# Patient Record
Sex: Male | Born: 1950 | Hispanic: Yes | Marital: Married | Smoking: Never smoker
Health system: Southern US, Community
[De-identification: ages and names within clinical notes are randomized; demographics above are authoritative.]

## PROBLEM LIST (undated history)

## (undated) DIAGNOSIS — G4730 Sleep apnea, unspecified: Secondary | ICD-10-CM

## (undated) DIAGNOSIS — E782 Mixed hyperlipidemia: Secondary | ICD-10-CM

## (undated) DIAGNOSIS — E119 Type 2 diabetes mellitus without complications: Secondary | ICD-10-CM

## (undated) DIAGNOSIS — R7303 Prediabetes: Secondary | ICD-10-CM

## (undated) DIAGNOSIS — I519 Heart disease, unspecified: Secondary | ICD-10-CM

## (undated) HISTORY — DX: Type 2 diabetes mellitus without complications: E11.9

## (undated) HISTORY — PX: NOSE SURGERY: SHX723

## (undated) HISTORY — DX: Sleep apnea, unspecified: G47.30

## (undated) HISTORY — DX: Mixed hyperlipidemia: E78.2

---
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
## 2018-04-06 ENCOUNTER — Ambulatory Visit (INDEPENDENT_AMBULATORY_CARE_PROVIDER_SITE_OTHER): Payer: Medicare HMO | Admitting: Family Medicine

## 2018-04-06 ENCOUNTER — Encounter: Payer: Self-pay | Admitting: Family Medicine

## 2018-04-06 ENCOUNTER — Other Ambulatory Visit: Payer: Self-pay

## 2018-04-06 VITALS — BP 130/70 | HR 64 | Temp 98.2°F | Ht 67.0 in | Wt 249.4 lb

## 2018-04-06 DIAGNOSIS — Z1211 Encounter for screening for malignant neoplasm of colon: Secondary | ICD-10-CM

## 2018-04-06 DIAGNOSIS — R0683 Snoring: Secondary | ICD-10-CM

## 2018-04-06 DIAGNOSIS — L814 Other melanin hyperpigmentation: Secondary | ICD-10-CM

## 2018-04-06 DIAGNOSIS — E669 Obesity, unspecified: Secondary | ICD-10-CM | POA: Diagnosis not present

## 2018-04-06 NOTE — Progress Notes (Signed)
Subjective  CC:  Chief Complaint  Patient presents with  . Establish Care    Transfer from [facility], Last Physical 2 years ago   . Skin Problem    dark spot under left eye, no itching.     HPI: Roy Harris is a 67 y.o. male who presents to [place] at [facility] today to establish care with me as a new patient.   He has the following concerns or needs:  Was at dentist last week: told to get lesion on left cheek checked. Pt reports has had brown growing lesion on left cheek x many years, asymptomatic. Lots of sun exposure - from [place]. No h/o skin cancer or family history of same.   PMH: unremarkable except for obesity: stable. Reports "eats everything". No h/o htn, hld or DM. Feels well.   HM: due for CPE; thinks imms are up to date but not certain. Need records from [place]. Never has had CRC screen: avg risk.   ROS: + snoring and paroxysmal orthopnea. Feels drowsy upon awakening. ? OSA.  Assessment  1. Lentigo   2. Colon cancer screening   3. Snoring   4. Obesity (BMI 30-39.9)      Plan   Lentigo vs lentigo maligna; return for punch bx.   Counseled on crc [place] options. Pt elects cologuard. Ordered.   Snoring: refer for sleep study.   Obesity: rec healthy diet and weight loss.   HM: return for CPE with labs. Will update imms at that time if needed.   Follow up:  Return in about 2 weeks (around 04/20/2018) for punch bx of skin lesion on left cheek, and cpe in 1-6 months. Orders Placed This Encounter  Procedures  . Cologuard  . PSG Sleep Study   No orders of the defined types were placed in this encounter.     We updated and reviewed the patient's past history in detail and it is documented below.  Patient Active Problem List   Diagnosis Date Noted  . Lentigo 04/06/2018  . Colon cancer screening 04/06/2018  . Snoring 04/06/2018  . Obesity (BMI 30-39.9) 04/06/2018   Health Maintenance  Topic Date Due  . Hepatitis C  Screening  [birth year]  . TETANUS/TDAP  04/16/1969  . Fecal DNA (Cologuard)  04/16/2000  . PNA vac Low Risk Adult (1 of 2 - PCV13) 04/17/2015  . INFLUENZA VACCINE  06/08/2018    There is no immunization history on file for this patient. No outpatient medications have been marked as taking for the 04/06/18 encounter (Office Visit) with [name], MD.    Allergies: Patient has No Known Allergies. Past Medical History Patient  has no past medical history on file. Past Surgical History Patient  has no past surgical history on file. Family History: Patient family history includes Alcohol abuse in his father; COPD in his mother; Clotting disorder in his sister; Diabetes in his daughter; Healthy in his son; Kidney disease in his brother; Stroke in his father. Social History:  Patient  reports that he has never smoked. He has never used smokeless tobacco. He reports that he does not drink alcohol or use drugs.  Review of Systems: Constitutional: negative for fever or malaise Ophthalmic: negative for photophobia, double vision or loss of vision, no blurred vision Cardiovascular: negative for chest pain, dyspnea on exertion, or new LE swelling Respiratory: negative for SOB or persistent cough Gastrointestinal: negative for abdominal pain, change in bowel habits or melena Genitourinary: negative  for dysuria or gross hematuria, no polyuria Musculoskeletal: negative for new gait disturbance or muscular weakness Integumentary: negative for new or persistent rashes Neurological: negative for TIA or stroke symptoms Psychiatric: negative for SI or delusions Allergic/Immunologic: negative for hives  Patient Care Team    Relationship Specialty Notifications Start End  [name], MD PCP - General Family Medicine  04/06/18     Objective  Vitals: BP 130/70   Pulse 64   Temp 98.2 F (36.8 C)   Ht 5\' 7"  (1.702 m)   Wt 249 lb 6.4 oz (113.1 kg)   BMI 39.06 kg/m  General:  Well  developed, well nourished, no acute distress  Psych:  Alert and oriented,normal mood and affect HEENT:  Normocephalic, atraumatic, non-icteric sclera, PERRL, oropharynx is without mass or exudate, supple neck  Cardiovascular:  RRR without gallop, rub or murmur, nondisplaced PMI, no peripheral edema Respiratory:  Good breath sounds bilaterally, CTAB with normal respiratory effort Skin:  Warm, left upper cheek with hyperpigmented ovoid lesion approx 1.5cm with mild color variation. Borders are smooth and not raised. Has several lentigo elsewhere, smaller on face, no flaking Neurologic:    Mental status is normal. Gross motor and sensory exams are normal. Normal gait   Commons side effects, risks, benefits, and alternatives for medications and treatment plan prescribed today were discussed, and the patient expressed understanding of the given instructions. Patient is instructed to call or message via MyChart if he/she has any questions or concerns regarding our treatment plan. No barriers to understanding were identified. We discussed Red Flag symptoms and signs in detail. Patient expressed understanding regarding what to do in case of urgent or emergency type symptoms.   Medication list was reconciled, printed and provided to the patient in AVS. Patient instructions and summary information was reviewed with the patient as documented in the AVS. This note was prepared with assistance of Dragon voice recognition software. Occasional wrong-word or sound-a-like substitutions may have occurred due to the inherent limitations of voice recognition software

## 2018-04-06 NOTE — Patient Instructions (Addendum)
Please return in 1-6 months for your annual complete physical; please come fasting. As well, please set up an appointment for a biopsy of the lesion on your cheek to make sure it is ok.  Please sign a release of records so I may request your records from [facility] center.   We will call you with information regarding your referral appointment. Sleep medicine for a sleep apnea evaluation   It was a pleasure meeting you today! Thank you for choosing [place] to meet your healthcare needs! I truly look forward to working with you. If you have any questions or concerns, please send me a message via Mychart or call the office at [phone].  Sleep Studies A sleep study (polysomnogram) is a series of tests done while you are sleeping. It can show how well you sleep. This can help your health care provider diagnose a sleep disorder and show how severe your sleep disorder is. A sleep study may lead to treatment that will help you sleep better and prevent other medical problems caused by poor sleep. If you have a sleep disorder, you may also be at risk for:  Sleep-related accidents.  High blood pressure.  Heart disease.  Stroke.  Other medical conditions.  Sleep disorders are common. Your health care provider may suspect a sleep disorder if you:  Have loud snoring most nights.  Have brief periods when you stop breathing at night.  Feel sleepy on most days.  Fall asleep suddenly during the day.  Have trouble falling asleep or staying asleep.  Feel like you need to move your legs when trying to fall asleep.  Have dreams that seem very real shortly after falling asleep.  Feel like you cannot move when you first wake up.  Which tests will I need to have? Most sleep studies last all night and include these tests:  Recordings of your brain activity.  Recordings of your eye movements.  Recording of your heart rate and rhythm.  Blood pressure readings.  Readings of the amount of  oxygen in your blood.  Measurements of your chest and belly movement as you breathe during sleep.  If you have signs of the sleep disorder called sleep apnea during your test, you may get a mask to wear for the second half of the night.  The mask provides continuous positive airway pressure (CPAP). This may improve sleep apnea significantly.  You will then have all tests done again with the mask in place to see if your measurements and recordings change.  How are sleep studies done? Most sleep studies are done over one full night of sleep.  You will arrive at the study center in the evening and can go home in the morning.  Bring your pajamas and toothbrush.  Do not have caffeine on the day of your sleep study.  Your health care provider will let you know if you need to stop taking any of your regular medicines before the test.  To do the tests included in a polysomnogram, you will have:  Round, sticky patches with sensors attached to recording wires (electrodes) placed on your scalp, face, chest, and limbs.  Wires from all the electrodes and sensors run from your bed to a computer. The wires can be taken off and put back on if you need to get out of bed to go to the bathroom.  A sensor placed over your nose to measure airflow.  A finger clip put on one finger to measure your blood  oxygen level.  A belt around your belly and a belt around your chest to measure breathing movements.  Where are sleep studies done? Sleep studies are done at sleep centers. A sleep center may be inside a hospital, office, or clinic. The room where you have the study may look like a hospital room or a hotel room. The health care providers doing the study may come in and out of the room during the study. Most of the time, they will be in another room monitoring your test. How is information from sleep studies helpful? A polysomnogram can be used along with your medical history and a physical exam to  diagnose conditions, such as:  Sleep apnea.  Restless legs syndrome.  Sleep-related seizure disorders.  Sleep-related movement disorders.  A medical doctor who specializes in sleep will evaluate your sleep study. The specialist will share the results with your primary health care provider. Treatments based on your sleep study may include:  Improving your sleep habits (sleep hygiene).  Wearing a CPAP mask.  Wearing an oral device at night to improve breathing and reduce snoring.  Taking medicine for: ? Restless legs syndrome. ? Sleep-related seizure disorder. ? Sleep-related movement disorder.  This information is not intended to replace advice given to you by your health care provider. Make sure you discuss any questions you have with your health care provider. Document Released: 05/01/2003 Document Revised: 06/20/2016 Document Reviewed: 12/31/2013 Elsevier Interactive Patient Education  2018 [employer].   Fat and Cholesterol Restricted Diet Getting too much fat and cholesterol in your diet may cause health problems. Following this diet helps keep your fat and cholesterol at normal levels. This can keep you from getting sick. What types of fat should I choose?  Choose monosaturated and polyunsaturated fats. These are found in foods such as olive oil, canola oil, flaxseeds, walnuts, almonds, and seeds.  Eat more omega-3 fats. Good choices include salmon, mackerel, sardines, tuna, flaxseed oil, and ground flaxseeds.  Limit saturated fats. These are in animal products such as meats, butter, and cream. They can also be in plant products such as palm oil, palm kernel oil, and coconut oil.  Avoid foods with partially hydrogenated oils in them. These contain trans fats. Examples of foods that have trans fats are stick margarine, some tub margarines, cookies, crackers, and other baked goods. What general guidelines do I need to follow?  Check food labels. Look for the words "trans  fat" and "saturated fat."  When preparing a meal: ? Fill half of your plate with vegetables and green salads. ? Fill one fourth of your plate with whole grains. Look for the word "whole" as the first word in the ingredient list. ? Fill one fourth of your plate with lean protein foods.  Eat more foods that have fiber, like apples, carrots, beans, peas, and barley.  Eat more home-cooked foods. Eat less at restaurants and buffets.  Limit or avoid alcohol.  Limit foods high in starch and sugar.  Limit fried foods.  Cook foods without frying them. Baking, boiling, grilling, and broiling are all great options.  Lose weight if you are overweight. Losing even a small amount of weight can help your overall health. It can also help prevent diseases such as diabetes and heart disease. What foods can I eat? Grains Whole grains, such as whole wheat or whole grain breads, crackers, cereals, and pasta. Unsweetened oatmeal, bulgur, barley, quinoa, or brown rice. Corn or whole wheat flour tortillas. Vegetables Fresh or frozen  vegetables (raw, steamed, roasted, or grilled). Green salads. Fruits All fresh, canned (in natural juice), or frozen fruits. Meat and Other Protein Products Ground beef (85% or leaner), grass-fed beef, or beef trimmed of fat. Skinless chicken or [place]. Ground chicken or [place]. Pork trimmed of fat. All fish and seafood. Eggs. Dried beans, peas, or lentils. Unsalted nuts or seeds. Unsalted canned or dry beans. Dairy Low-fat dairy products, such as skim or 1% milk, 2% or reduced-fat cheeses, low-fat ricotta or cottage cheese, or plain low-fat yogurt. Fats and Oils Tub margarines without trans fats. Light or reduced-fat mayonnaise and salad dressings. Avocado. Olive, canola, sesame, or safflower oils. Natural peanut or almond butter (choose ones without added sugar and oil). The items listed above may not be a complete list of recommended foods or beverages. Contact your dietitian  for more options. What foods are not recommended? Grains White bread. White pasta. White rice. Cornbread. Bagels, pastries, and croissants. Crackers that contain trans fat. Vegetables White potatoes. Corn. Creamed or fried vegetables. Vegetables in a cheese sauce. Fruits Dried fruits. Canned fruit in light or heavy syrup. Fruit juice. Meat and Other Protein Products Fatty cuts of meat. Ribs, chicken wings, bacon, sausage, bologna, salami, chitterlings, fatback, hot dogs, bratwurst, and packaged luncheon meats. Liver and organ meats. Dairy Whole or 2% milk, cream, half-and-half, and cream cheese. Whole milk cheeses. Whole-fat or sweetened yogurt. Full-fat cheeses. Nondairy creamers and whipped toppings. Processed cheese, cheese spreads, or cheese curds. Sweets and Desserts Corn syrup, sugars, honey, and molasses. Candy. Jam and jelly. Syrup. Sweetened cereals. Cookies, pies, cakes, donuts, muffins, and ice cream. Fats and Oils Butter, stick margarine, lard, shortening, ghee, or bacon fat. Coconut, palm kernel, or palm oils. Beverages Alcohol. Sweetened drinks (such as sodas, lemonade, and fruit drinks or punches). The items listed above may not be a complete list of foods and beverages to avoid. Contact your dietitian for more information. This information is not intended to replace advice given to you by your health care provider. Make sure you discuss any questions you have with your health care provider. Document Released: 04/25/2012 Document Revised: 07/01/2016 Document Reviewed: 01/24/2014 Elsevier Interactive Patient Education  2018 [employer].   Exercise to Lose Weight Exercise and a healthy diet may help you lose weight. Your doctor may suggest specific exercises. EXERCISE IDEAS AND TIPS  Choose low-cost things you enjoy doing, such as walking, bicycling, or exercising to workout videos.  Take stairs instead of the elevator.  Walk during your lunch break.  Park your car  further away from work or school.  Go to a gym or an exercise class.  Start with 5 to 10 minutes of exercise each day. Build up to 30 minutes of exercise 4 to 6 days a week.  Wear shoes with good support and comfortable clothes.  Stretch before and after working out.  Work out until you breathe harder and your heart beats faster.  Drink extra water when you exercise.  Do not do so much that you hurt yourself, feel dizzy, or get very short of breath. Exercises that burn about 150 calories:  Running 1  miles in 15 minutes.  Playing volleyball for 45 to 60 minutes.  Washing and waxing a car for 45 to 60 minutes.  Playing touch football for 45 minutes.  Walking 1  miles in 35 minutes.  Pushing a stroller 1  miles in 30 minutes.  Playing basketball for 30 minutes.  Raking leaves for 30 minutes.  Bicycling 5  miles in 30 minutes.  Walking 2 miles in 30 minutes.  Dancing for 30 minutes.  Shoveling snow for 15 minutes.  Swimming laps for 20 minutes.  Walking up stairs for 15 minutes.  Bicycling 4 miles in 15 minutes.  Gardening for 30 to 45 minutes.  Jumping rope for 15 minutes.  Washing windows or floors for 45 to 60 minutes.

## 2018-04-07 ENCOUNTER — Other Ambulatory Visit: Payer: Self-pay | Admitting: *Deleted

## 2018-04-07 DIAGNOSIS — L989 Disorder of the skin and subcutaneous tissue, unspecified: Secondary | ICD-10-CM

## 2018-04-10 ENCOUNTER — Ambulatory Visit: Payer: Medicare HMO | Admitting: Family Medicine

## 2018-04-18 ENCOUNTER — Telehealth: Payer: Self-pay | Admitting: Family Medicine

## 2018-04-18 NOTE — Telephone Encounter (Signed)
Paperwork filled out and faxed to [phone].    Doloris Hall,  LPN

## 2018-04-18 NOTE — Telephone Encounter (Signed)
Received paperwork, via fax, from [facility] in reference to Clinical Review for patient orders. Requesting the paperwork be reviewed, completed, and faxed back. Placed in front bin with charge sheet. Tracking # for case: [id number]

## 2018-04-21 ENCOUNTER — Telehealth: Payer: Self-pay | Admitting: Family Medicine

## 2018-04-21 NOTE — Telephone Encounter (Signed)
Received paperwork, via fax, from [place]. Requesting the paperwork be reviewed, completed, and faxed back. Placed in front bin with charge sheet.

## 2018-04-24 NOTE — Telephone Encounter (Signed)
Forms faxed back with requested clinical information

## 2018-04-25 ENCOUNTER — Telehealth: Payer: Self-pay | Admitting: Emergency Medicine

## 2018-04-25 DIAGNOSIS — R0683 Snoring: Secondary | ICD-10-CM

## 2018-04-25 NOTE — Telephone Encounter (Signed)
Please advise.    Reason for CRM: Dr. Quentin Cornwall called in to request peer to peer consultation with Dr. Jonni Sanger. Cb# [phone]

## 2018-04-25 NOTE — Telephone Encounter (Signed)
Peer to Peer call completed between Dr. Jonni Sanger & Dr. Quentin Cornwall.   Doloris Hall,  LPN

## 2018-04-25 NOTE — Telephone Encounter (Signed)
[facility] prefers home sleep study. Ordered.

## 2018-04-28 ENCOUNTER — Encounter: Payer: Medicare HMO | Admitting: Family Medicine

## 2018-05-01 LAB — COLOGUARD: Cologuard: NEGATIVE

## 2018-05-02 NOTE — Progress Notes (Signed)
Please call patient: I have reviewed his/her lab results. cologuard testing is negative; can be repeated in 3 years. This is good news.

## 2018-06-01 ENCOUNTER — Encounter: Payer: Medicare HMO | Admitting: Family Medicine

## 2018-06-06 ENCOUNTER — Encounter: Payer: Medicare HMO | Admitting: Family Medicine

## 2018-06-06 DIAGNOSIS — Z0289 Encounter for other administrative examinations: Secondary | ICD-10-CM

## 2018-06-30 ENCOUNTER — Ambulatory Visit (INDEPENDENT_AMBULATORY_CARE_PROVIDER_SITE_OTHER): Payer: Medicare HMO | Admitting: Physician Assistant

## 2018-06-30 ENCOUNTER — Other Ambulatory Visit: Payer: Self-pay

## 2018-06-30 ENCOUNTER — Encounter: Payer: Self-pay | Admitting: Physician Assistant

## 2018-06-30 VITALS — BP 110/80 | HR 85 | Temp 98.0°F | Resp 16 | Ht 67.0 in | Wt 218.0 lb

## 2018-06-30 DIAGNOSIS — L239 Allergic contact dermatitis, unspecified cause: Secondary | ICD-10-CM | POA: Diagnosis not present

## 2018-06-30 DIAGNOSIS — M722 Plantar fascial fibromatosis: Secondary | ICD-10-CM

## 2018-06-30 DIAGNOSIS — H6981 Other specified disorders of Eustachian tube, right ear: Secondary | ICD-10-CM

## 2018-06-30 MED ORDER — MELOXICAM 15 MG PO TABS: 15.0000 mg | ORAL_TABLET | Freq: Every day | ORAL | 0 refills | Status: DC

## 2018-06-30 MED ORDER — FLUTICASONE PROPIONATE 50 MCG/ACT NA SUSP: 2.0000 | Freq: Every day | NASAL | 6 refills | Status: DC

## 2018-06-30 MED ORDER — TRIAMCINOLONE ACETONIDE 0.1 % EX CREA: 1.0000 "application " | TOPICAL_CREAM | Freq: Two times a day (BID) | CUTANEOUS | 0 refills | Status: DC

## 2018-06-30 NOTE — Patient Instructions (Signed)
Apply the triamcinolone cream to the rash as directed twice daily for 2 weeks. Avoid scratching the area if possible.   For the ear pressure, start your Flonase once daily as directed. Continue your saline nasal rinses.   For the plantar fasciitis, take Meloxicam once daily with food. Use tylenol if needed for breakthrough pain.  Wear supportive footwear.  Start the cold can exercises that we discussed.  Start the recommended exercises below. If not improving, we will need a further assessment with Podiatry.   Plantar Fasciitis Rehab Ask your health care provider which exercises are safe for you. Do exercises exactly as told by your health care provider and adjust them as directed. It is normal to feel mild stretching, pulling, tightness, or discomfort as you do these exercises, but you should stop right away if you feel sudden pain or your pain gets worse. Do not begin these exercises until told by your health care provider. Stretching and range of motion exercises These exercises warm up your muscles and joints and improve the movement and flexibility of your foot. These exercises also help to relieve pain. Exercise A: Plantar fascia stretch  1. Sit with your left / right leg crossed over your opposite knee. 2. Hold your heel with one hand with that thumb near your arch. With your other hand, hold your toes and gently pull them back toward the top of your foot. You should feel a stretch on the bottom of your toes or your foot or both. 3. Hold this stretch for__________ seconds. 4. Slowly release your toes and return to the starting position. Repeat __________ times. Complete this exercise __________ times a day. Exercise B: Gastroc, standing  1. Stand with your hands against a wall. 2. Extend your left / right leg behind you, and bend your front knee slightly. 3. Keeping your heels on the floor and keeping your back knee straight, shift your weight toward the wall without arching your  back. You should feel a gentle stretch in your left / right calf. 4. Hold this position for __________ seconds. Repeat __________ times. Complete this exercise __________ times a day. Exercise C: Soleus, standing 1. Stand with your hands against a wall. 2. Extend your left / right leg behind you, and bend your front knee slightly. 3. Keeping your heels on the floor, bend your back knee and slightly shift your weight over the back leg. You should feel a gentle stretch deep in your calf. 4. Hold this position for __________ seconds. Repeat __________ times. Complete this exercise __________ times a day. Exercise D: Gastrocsoleus, standing 1. Stand with the ball of your left / right foot on a step. The ball of your foot is on the walking surface, right under your toes. 2. Keep your other foot firmly on the same step. 3. Hold onto the wall or a railing for balance. 4. Slowly lift your other foot, allowing your body weight to press your heel down over the edge of the step. You should feel a stretch in your left / right calf. 5. Hold this position for __________ seconds. 6. Return both feet to the step. 7. Repeat this exercise with a slight bend in your left / right knee. Repeat __________ times with your left / right knee straight and __________ times with your left / right knee bent. Complete this exercise __________ times a day. Balance exercise This exercise builds your balance and strength control of your arch to help take pressure off your plantar fascia.  Exercise E: Single leg stand 1. Without shoes, stand near a railing or in a doorway. You may hold onto the railing or door frame as needed. 2. Stand on your left / right foot. Keep your big toe down on the floor and try to keep your arch lifted. Do not let your foot roll inward. 3. Hold this position for __________ seconds. 4. If this exercise is too easy, you can try it with your eyes closed or while standing on a pillow. Repeat __________  times. Complete this exercise __________ times a day. This information is not intended to replace advice given to you by your health care provider. Make sure you discuss any questions you have with your health care provider. Document Released: 10/25/2005 Document Revised: 06/29/2016 Document Reviewed: 09/08/2015 Elsevier Interactive Patient Education  2018 [employer].

## 2018-06-30 NOTE — Progress Notes (Signed)
Patient presents to clinic today c/o 8 months of R heel pain described as a sharp pain. Is worse with ambulation and with his first step in the morning and after an episode of prolonged sitting. Notes his first few steps are horrendous. Denies any trauma or injury. Denies skin changes. Has not taken anything for symptoms.   Patient also notes an itchy red rash of his arms, legs and torso starting 1 week ago after working outside. Denies fever, chills. Denies change to soaps/lotions/detergents. Denies recent travel or sick contact.   Patient also endorses a couple of weeks of pressure and popping in his R ear. Denies decreased hearing but notes it is somewhat muffled. Denies tinnitus, ear pain or discharge.   History reviewed. No pertinent past medical history.  No current outpatient medications on file prior to visit.   No current facility-administered medications on file prior to visit.     No Known Allergies  Family History  Problem Relation Age of Onset  . COPD Mother   . Stroke Father   . Alcohol abuse Father   . Clotting disorder Sister   . Diabetes Daughter   . Healthy Son   . Kidney disease Brother   . Cancer Neg Hx     Social History   Socioeconomic History  . Marital status: Married    Spouse name: Not on file  . Number of children: 3  . Years of education: Not on file  . Highest education level: Not on file  Occupational History  . Occupation: [employer], Ambulance person RETIRED  Social Needs  . Financial resource strain: Not on file  . Food insecurity:    Worry: Not on file    Inability: Not on file  . Transportation needs:    Medical: Not on file    Non-medical: Not on file  Tobacco Use  . Smoking status: Never Smoker  . Smokeless tobacco: Never Used  Substance and Sexual Activity  . Alcohol use: Never    Frequency: Never  . Drug use: Never  . Sexual activity: Yes  Lifestyle  . Physical activity:    Days per week: Not on file    Minutes per  session: Not on file  . Stress: Not on file  Relationships  . Social connections:    Talks on phone: Not on file    Gets together: Not on file    Attends religious service: Not on file    Active member of club or organization: Not on file    Attends meetings of clubs or organizations: Not on file    Relationship status: Not on file  Other Topics Concern  . Not on file  Social History Narrative   Originally from [place], moved to [place] in 2012; married. 3 children. Family in here in [place] as well   Review of Systems - See HPI.  All other ROS are negative.  BP 110/80   Pulse 85   Temp 98 F (36.7 C) (Oral)   Resp 16   Ht 5\' 7"  (1.702 m)   Wt 218 lb (98.9 kg)   SpO2 97%   BMI 34.14 kg/m   Physical Exam  Constitutional: He appears well-developed and well-nourished.  HENT:  Head: Normocephalic and atraumatic.  Right Ear: Hearing and ear canal normal.  Left Ear: Hearing and ear canal normal.  Serous fluid noted behind R TM  Neck: Neck supple.  Cardiovascular: Normal rate, regular rhythm, normal heart sounds and intact distal pulses.  Pulmonary/Chest: Effort normal.  Musculoskeletal:       Feet:  Lymphadenopathy:    He has no cervical adenopathy.  Skin:     Psychiatric: He has a normal mood and affect.  Vitals reviewed.   Recent Results (from the past 2160 hour(s))  Cologuard     Status: None   Collection Time: 04/22/18 12:00 AM  Result Value Ref Range   Cologuard Negative     Assessment/Plan: 1. Plantar fasciitis, right Start meloxicam once daily with food. Tylenol for breakthrough pain. Start cold can exercise. Rehabilitation exercises reviewed and handout given. Discussed need for supportive footwear. Will need assessment by Podiatry if not resolving. - meloxicam (MOBIC) 15 MG tablet; Take 1 tablet (15 mg total) by mouth daily.  Dispense: 15 tablet; Refill: 0  2. Allergic contact dermatitis, unspecified trigger Mild, small patches. Start Triamcinolone.  Supportive measures reviewed. - triamcinolone cream (KENALOG) 0.1 %; Apply 1 application topically 2 (two) times daily.  Dispense: 30 g; Refill: 0  3. Eustachian tube dysfunction, right Start Flonase to help with this. Continue saline nasal rinse and insufflation. - fluticasone (FLONASE) 50 MCG/ACT nasal spray; Place 2 sprays into both nostrils daily.  Dispense: 16 g; Refill: [place], [place]

## 2018-07-07 ENCOUNTER — Other Ambulatory Visit: Payer: Self-pay

## 2018-07-07 ENCOUNTER — Encounter: Payer: Self-pay | Admitting: Family Medicine

## 2018-07-07 ENCOUNTER — Ambulatory Visit (INDEPENDENT_AMBULATORY_CARE_PROVIDER_SITE_OTHER): Payer: Medicare HMO | Admitting: Family Medicine

## 2018-07-07 VITALS — BP 106/72 | HR 59 | Temp 97.6°F | Ht 67.0 in | Wt 248.2 lb

## 2018-07-07 DIAGNOSIS — Z0000 Encounter for general adult medical examination without abnormal findings: Secondary | ICD-10-CM

## 2018-07-07 DIAGNOSIS — R0683 Snoring: Secondary | ICD-10-CM | POA: Diagnosis not present

## 2018-07-07 DIAGNOSIS — E785 Hyperlipidemia, unspecified: Secondary | ICD-10-CM | POA: Diagnosis not present

## 2018-07-07 DIAGNOSIS — L255 Unspecified contact dermatitis due to plants, except food: Secondary | ICD-10-CM | POA: Diagnosis not present

## 2018-07-07 DIAGNOSIS — Z1159 Encounter for screening for other viral diseases: Secondary | ICD-10-CM

## 2018-07-07 DIAGNOSIS — Z23 Encounter for immunization: Secondary | ICD-10-CM | POA: Diagnosis not present

## 2018-07-07 DIAGNOSIS — E669 Obesity, unspecified: Secondary | ICD-10-CM

## 2018-07-07 LAB — LIPID PANEL
Cholesterol: 216 mg/dL — ABNORMAL HIGH (ref 0–200)
HDL: 31.6 mg/dL — AB (ref >39.00)
LDL Cholesterol: 160 mg/dL — ABNORMAL HIGH (ref 0–99)
NONHDL: 184.49
Total CHOL/HDL Ratio: 7
Triglycerides: 123 mg/dL (ref 0.0–149.0)
VLDL: 24.6 mg/dL (ref 0.0–40.0)

## 2018-07-07 LAB — CBC WITH DIFFERENTIAL/PLATELET
BASOS PCT: 0.6 % (ref 0.0–3.0)
Basophils Absolute: 0 10*3/uL (ref 0.0–0.1)
EOS PCT: 2.4 % (ref 0.0–5.0)
Eosinophils Absolute: 0.1 10*3/uL (ref 0.0–0.7)
HCT: 45.4 % (ref 39.0–52.0)
HEMOGLOBIN: 15.1 g/dL (ref 13.0–17.0)
LYMPHS ABS: 1.4 10*3/uL (ref 0.7–4.0)
Lymphocytes Relative: 28 % (ref 12.0–46.0)
MCHC: 33.3 g/dL (ref 30.0–36.0)
MCV: 88.9 fl (ref 78.0–100.0)
MONO ABS: 0.4 10*3/uL (ref 0.1–1.0)
Monocytes Relative: 8.9 % (ref 3.0–12.0)
NEUTROS ABS: 3 10*3/uL (ref 1.4–7.7)
Neutrophils Relative %: 60.1 % (ref 43.0–77.0)
PLATELETS: 181 10*3/uL (ref 150.0–400.0)
RBC: 5.11 Mil/uL (ref 4.22–5.81)
RDW: 13.2 % (ref 11.5–15.5)
WBC: 5 10*3/uL (ref 4.0–10.5)

## 2018-07-07 LAB — COMPREHENSIVE METABOLIC PANEL
ALK PHOS: 69 U/L (ref 39–117)
ALT: 30 U/L (ref 0–53)
AST: 27 U/L (ref 0–37)
Albumin: 4.1 g/dL (ref 3.5–5.2)
BILIRUBIN TOTAL: 1.8 mg/dL — AB (ref 0.2–1.2)
BUN: 18 mg/dL (ref 6–23)
CALCIUM: 9.4 mg/dL (ref 8.4–10.5)
CO2: 26 mEq/L (ref 19–32)
Chloride: 106 mEq/L (ref 96–112)
Creatinine, Ser: 1 mg/dL (ref 0.40–1.50)
GFR: 78.93 mL/min (ref >60.00)
GLUCOSE: 107 mg/dL — AB (ref 70–99)
Potassium: 4.3 mEq/L (ref 3.5–5.1)
Sodium: 139 mEq/L (ref 135–145)
Total Protein: 6.7 g/dL (ref 6.0–8.3)

## 2018-07-07 MED ORDER — ZOSTER VAC RECOMB ADJUVANTED 50 MCG/0.5ML IM SUSR: 0.5000 mL | Freq: Once | INTRAMUSCULAR | 0 refills | Status: AC

## 2018-07-07 MED ORDER — PREDNISONE 10 MG PO TABS: ORAL_TABLET | ORAL | 0 refills | Status: DC

## 2018-07-07 MED ORDER — PNEUMOCOCCAL 13-VAL CONJ VACC IM SUSP: 0.5000 mL | Freq: Once | INTRAMUSCULAR | 0 refills | Status: AC

## 2018-07-07 NOTE — Patient Instructions (Signed)
Please return in 12 months for your annual complete physical; please come fasting. We will call you with your lab results or send you a letter if they are all normal.   Please go to your pharmacy to pick up the prednisone and to get your pneumonia and shingles vaccinations.   We will call you with information regarding your referral appointment. Sleep study.  If you do not hear from [place] within the next 2 weeks, please let me know. It can take 1-2 weeks to get appointments set up with the specialists.    If you have any questions or concerns, please don't hesitate to send me a message via MyChart or call the office at [phone]. Thank you for visiting with [place] today! It's our pleasure caring for you.  Please do these things to maintain good health!   Exercise at least 30-45 minutes a day,  4-5 days a week.   Eat a low-fat diet with lots of fruits and vegetables, up to 7-9 servings per day.  Drink plenty of water daily. Try to drink 8 8oz glasses per day.  Seatbelts can save your life. Always wear your seatbelt.  Place Smoke Detectors on every level of your home and check batteries every year.  Eye Doctor - have an eye exam every 1-2 years  Safe sex - use condoms to protect yourself from STDs if you could be exposed to these types of infections.  Avoid heavy alcohol use. If you drink, keep it to less than 2 drinks/day and not every day.  [place].  Choose someone you trust that could speak for you if you became unable to speak for yourself.  Depression is common in our stressful world.If you're feeling down or losing interest in things you normally enjoy, please come in for a visit.

## 2018-07-07 NOTE — Progress Notes (Signed)
Subjective  Chief Complaint  Patient presents with  . Annual Exam    doing well, wants flu shot, Pneumonia and Tdap today   . Skin Problem    itching all over body, not as bad as it was but still there   HPI: Roy Harris is a 67 y.o. male who presents to [place] [facility] today for a Male Wellness Visit.   Wellness Visit: annual visit with health maintenance review and exam    67 year old male here for complete physical.  Says he is doing very well.  Tries to eat well.  But does need to lose weight.  Reviewed healthy eating habits review of systems is negative except for itching:  Seen last week for rash and treated with steroid cream, however 3 weeks of maculopapular rash.  He had been working out in the yard.  Rashes on extremities mostly.  No hives.  No systemic symptoms.  Snoring and feeling tired during the day: Sleep study was never scheduled.  See last visit.  Health maintenance: Due for Tdap, Prevnar, flu vaccination, Shingrix.  Cologuard was negative. Lifestyle: Body mass index is 38.87 kg/m. Wt Readings from Last 3 Encounters:  07/07/18 248 lb 3.2 oz (112.6 kg)  06/30/18 218 lb (98.9 kg)  04/06/18 249 lb 6.4 oz (113.1 kg)    Patient Active Problem List   Diagnosis Date Noted  . Lentigo 04/06/2018  . Colon cancer screening 04/06/2018  . Snoring 04/06/2018  . Obesity (BMI 30-39.9) 04/06/2018   Health Maintenance  Topic Date Due  . Hepatitis C Screening  04-05-1951  . TETANUS/TDAP  04/16/1969  . PNA vac Low Risk Adult (1 of 2 - PCV13) 04/17/2015  . INFLUENZA VACCINE  06/08/2018  . Fecal DNA (Cologuard)  04/22/2021    There is no immunization history on file for this patient. We updated and reviewed the patient's past history in detail and it is documented below. Allergies: Patient has No Known Allergies. Past Medical History  has no past medical history on file. Past Surgical History  has no past surgical history on  file. Social History Patient  reports that he has never smoked. He has never used smokeless tobacco. He reports that he does not drink alcohol or use drugs. Family History Patient family history includes Alcohol abuse in his father; COPD in his mother; Clotting disorder in his sister; Diabetes in his daughter; Healthy in his son; Kidney disease in his brother; Stroke in his father. Review of Systems: Constitutional: negative for fever or malaise Ophthalmic: negative for photophobia, double vision or loss of vision Cardiovascular: negative for chest pain, dyspnea on exertion, or new LE swelling Respiratory: negative for SOB or persistent cough Gastrointestinal: negative for abdominal pain, change in bowel habits or melena Genitourinary: negative for dysuria or gross hematuria Musculoskeletal: negative for new gait disturbance or muscular weakness Integumentary: negative for new or persistent rashes, no breast lumps Neurological: negative for TIA or stroke symptoms Psychiatric: negative for SI or delusions Allergic/Immunologic: negative for hives  Patient Care Team    Relationship Specialty Notifications Start End  [name], MD PCP - General Family Medicine  04/06/18    Objective  Vitals: BP 106/72   Pulse (!) 59   Temp 97.6 F (36.4 C)   Ht 5\' 7"  (1.702 m)   Wt 248 lb 3.2 oz (112.6 kg)   SpO2 98%   BMI 38.87 kg/m  General:  Well developed, well nourished, no acute distress  Psych:  Alert and orientedx3,normal mood and affect HEENT:  Normocephalic, atraumatic, non-icteric sclera, PERRL, oropharynx is clear without mass or exudate, supple neck without adenopathy, mass or thyromegaly Cardiovascular:  Normal S1, S2, RRR without gallop, rub or murmur, nondisplaced PMI, +2 distal pulses in bilateral upper and lower extremities. Respiratory:  Good breath sounds bilaterally, CTAB with normal respiratory effort Gastrointestinal: normal bowel sounds, soft, non-tender, no noted masses.  No HSM MSK: no deformities, contusions. Joints are without erythema or swelling. Spine and CVA region are nontender Skin:  Warm, excoriations on upper extremities and lower extremities, maculopapular rash, some linear excoriations present.   Neurologic:    Mental status is normal. CN 2-11 are normal. Gross motor and sensory exams are normal. Stable gait. No tremor GU: No inguinal hernias or adenopathy are appreciated bilaterally  Assessment  1. Annual physical exam   2. Rhus dermatitis   3. Snoring   4. Obesity (BMI 30-39.9)      Plan  Male Wellness Visit:  Age appropriate Health Maintenance and Prevention measures were discussed with patient. Included topics are cancer screening recommendations, ways to keep healthy (see AVS) including dietary and exercise recommendations, regular eye and dental care, use of seat belts, and avoidance of moderate alcohol use and tobacco use.   BMI: discussed patient's BMI and encouraged positive lifestyle modifications to help get to or maintain a target BMI.  HM needs and immunizations were addressed and ordered. See below for orders. See HM and immunization section for updates.  Tdap and flu given today.  Prescriptions for Shingrix and Prevnar given and education done.  Routine labs and screening tests ordered including cmp, cbc and lipids where appropriate.  Discussed recommendations regarding Vit D and calcium supplementation (see AVS)  Suspect poison ivy: Prednisone taper ordered  Reorder sleep study to evaluate for sleep apnea  Follow up: No follow-ups on file.   Commons side effects, risks, benefits, and alternatives for medications and treatment plan prescribed today were discussed, and the patient expressed understanding of the given instructions. Patient is instructed to call or message via MyChart if he/she has any questions or concerns regarding our treatment plan. No barriers to understanding were identified. We discussed Red Flag  symptoms and signs in detail. Patient expressed understanding regarding what to do in case of urgent or emergency type symptoms.   Medication list was reconciled, printed and provided to the patient in AVS. Patient instructions and summary information was reviewed with the patient as documented in the AVS. This note was prepared with assistance of Dragon voice recognition software. Occasional wrong-word or sound-a-like substitutions may have occurred due to the inherent limitations of voice recognition software  No orders of the defined types were placed in this encounter.  Meds ordered this encounter  Medications  . predniSONE (DELTASONE) 10 MG tablet    Sig: Take 4 tabs qd x 2 days, 3 qd x 2 days, 2 qd x 2d, 1qd x 3 days    Dispense:  21 tablet    Refill:  0  . pneumococcal 13-valent conjugate vaccine (PREVNAR 13) SUSP injection    Sig: Inject 0.5 mLs into the muscle once for 1 dose.    Dispense:  0.5 mL    Refill:  0  . Zoster Vaccine Adjuvanted [facility]) injection    Sig: Inject 0.5 mLs into the muscle once for 1 dose. Please give 2nd dose 2-6 months after first dose    Dispense:  2 each    Refill:  0

## 2018-07-11 ENCOUNTER — Encounter: Payer: Self-pay | Admitting: Family Medicine

## 2018-07-11 DIAGNOSIS — E1169 Type 2 diabetes mellitus with other specified complication: Secondary | ICD-10-CM | POA: Insufficient documentation

## 2018-07-11 DIAGNOSIS — E782 Mixed hyperlipidemia: Secondary | ICD-10-CM

## 2018-07-11 HISTORY — DX: Mixed hyperlipidemia: E78.2

## 2018-07-11 LAB — HEPATITIS C ANTIBODY
Hepatitis C Ab: NONREACTIVE
SIGNAL TO CUT-OFF: 0.02 (ref <1.00)

## 2018-07-11 LAB — HIV ANTIBODY (ROUTINE TESTING W REFLEX): HIV: NONREACTIVE

## 2018-07-11 MED ORDER — ATORVASTATIN CALCIUM 10 MG PO TABS: 10.0000 mg | ORAL_TABLET | Freq: Every day | ORAL | 3 refills | Status: DC

## 2018-07-11 NOTE — Progress Notes (Signed)
Please call patient: I have reviewed his/her lab results. Lab results show elevated cholesterol levels. I recommend starting lipitor 10mg  nightly. Recommend an OV with me in 12 weeks to recheck and assess tolerance. Please come fasting. Other labs look fine. He has an elevated bilirubin but all other liver tests are normal: this could be a chronic finding. I will monitor over time. The 10-year ASCVD risk score Mikey Bussing DC Brooke Bonito., et al., 2013) is: 15.2%   Values used to calculate the score:     Age: 67 years     Sex: Male     Is Non-Hispanic African American: No     Diabetic: No     Tobacco smoker: No     Systolic Blood Pressure: 572 mmHg     Is BP treated: No     HDL Cholesterol: 31.6 mg/dL     Total Cholesterol: 216 mg/dL

## 2018-07-11 NOTE — Addendum Note (Signed)
Addended by: Billey Chang on: 07/11/2018 02:46 PM   Modules accepted: Orders

## 2018-07-13 ENCOUNTER — Telehealth: Payer: Self-pay | Admitting: Family Medicine

## 2018-07-13 NOTE — Telephone Encounter (Signed)
Copied from [place] [id number]. Topic: Quick Communication - See Telephone Encounter >> Jul 13, 2018 11:05 AM Gardiner Ramus wrote: CRM for notification. See Telephone encounter for: 07/13/18. Pt called and stated that they would like the last AVS 07/07/18 emailed to him. [email or web address]  please advise Cb#[phone]

## 2018-07-13 NOTE — Telephone Encounter (Signed)
Last Office Visit Faxed to Patient at [phone]  Doloris Hall,  LPN

## 2018-10-11 ENCOUNTER — Ambulatory Visit: Payer: Medicare HMO | Admitting: Family Medicine

## 2018-10-11 DIAGNOSIS — Z0289 Encounter for other administrative examinations: Secondary | ICD-10-CM

## 2019-05-31 ENCOUNTER — Ambulatory Visit (INDEPENDENT_AMBULATORY_CARE_PROVIDER_SITE_OTHER): Payer: Medicare HMO | Admitting: Family Medicine

## 2019-05-31 ENCOUNTER — Ambulatory Visit (INDEPENDENT_AMBULATORY_CARE_PROVIDER_SITE_OTHER): Payer: Medicare HMO

## 2019-05-31 ENCOUNTER — Other Ambulatory Visit: Payer: Self-pay

## 2019-05-31 ENCOUNTER — Encounter: Payer: Self-pay | Admitting: Family Medicine

## 2019-05-31 VITALS — BP 132/76 | HR 67 | Temp 98.0°F | Resp 16 | Ht 63.0 in | Wt 255.0 lb

## 2019-05-31 DIAGNOSIS — R0683 Snoring: Secondary | ICD-10-CM | POA: Diagnosis not present

## 2019-05-31 DIAGNOSIS — R0602 Shortness of breath: Secondary | ICD-10-CM

## 2019-05-31 DIAGNOSIS — M25562 Pain in left knee: Secondary | ICD-10-CM | POA: Diagnosis not present

## 2019-05-31 DIAGNOSIS — E782 Mixed hyperlipidemia: Secondary | ICD-10-CM | POA: Diagnosis not present

## 2019-05-31 LAB — LIPID PANEL
Cholesterol: 192 mg/dL (ref 0–200)
HDL: 32 mg/dL — ABNORMAL LOW (ref >39.00)
NonHDL: 159.55
Total CHOL/HDL Ratio: 6
Triglycerides: 267 mg/dL — ABNORMAL HIGH (ref 0.0–149.0)
VLDL: 53.4 mg/dL — ABNORMAL HIGH (ref 0.0–40.0)

## 2019-05-31 LAB — COMPREHENSIVE METABOLIC PANEL
ALT: 20 U/L (ref 0–53)
AST: 14 U/L (ref 0–37)
Albumin: 4.1 g/dL (ref 3.5–5.2)
Alkaline Phosphatase: 72 U/L (ref 39–117)
BUN: 20 mg/dL (ref 6–23)
CO2: 27 mEq/L (ref 19–32)
Calcium: 9.7 mg/dL (ref 8.4–10.5)
Chloride: 105 mEq/L (ref 96–112)
Creatinine, Ser: 1 mg/dL (ref 0.40–1.50)
GFR: 74.06 mL/min (ref >60.00)
Glucose, Bld: 140 mg/dL — ABNORMAL HIGH (ref 70–99)
Potassium: 3.9 mEq/L (ref 3.5–5.1)
Sodium: 138 mEq/L (ref 135–145)
Total Bilirubin: 1.6 mg/dL — ABNORMAL HIGH (ref 0.2–1.2)
Total Protein: 6.4 g/dL (ref 6.0–8.3)

## 2019-05-31 LAB — LDL CHOLESTEROL, DIRECT: Direct LDL: 131 mg/dL

## 2019-05-31 MED ORDER — ATORVASTATIN CALCIUM 10 MG PO TABS: 10.0000 mg | ORAL_TABLET | Freq: Every day | ORAL | 3 refills | Status: DC

## 2019-05-31 MED ORDER — DICLOFENAC SODIUM 75 MG PO TBEC: 75.0000 mg | DELAYED_RELEASE_TABLET | Freq: Two times a day (BID) | ORAL | 0 refills | Status: DC

## 2019-05-31 NOTE — Progress Notes (Signed)
Subjective  CC:  Chief Complaint  Patient presents with  . Knee Pain    Left side, got worse over the past 3 days and is taking Tylenol Arthritis with minimal relief  . Shortness of Breath    worse at night and wakes up disoriented     HPI: Roy Harris is a 68 y.o. male who presents to the office today to address the problems listed above in the chief complaint.  68 year old male with obesity presents with 2 to 3-day history of medial left knee pain.  Came on abruptly.  Sharp pain worse with walking.  Also painful when he moves or tries to turn over in bed at night.  Minimal pain if sitting.  Had similar pain a year or 2 ago which resolved spontaneously.  No history of osteoarthritis, no trauma orLimping.  No pain into the hip.  No swelling or warmth noted.  Tylenol was mildly helpful.  Obesity with snoring, wife witnesses apneic episodes at night.  Admits to daytime lethargy.  Ready to do sleep study to rule out obstructive sleep apnea.  He is high risk  Next hyperlipidemia: Started Lipitor last year, he did not follow-up for LFTs or repeat lipids.  He took it up until about a month ago when he said he ran out.  No myalgias or adverse effects.  He will restart it. Assessment  1. Snoring   2. Mixed hyperlipidemia   3. Acute pain of left knee   4. SOB (shortness of breath)      Plan   Snoring with witnessed apnea: Refer for sleep study.  Recommend weight loss  Next hyperlipidemia: Restart Lipitor and recheck labs today.  Then follow-up at complete physical in the next 6 weeks  Knee pain consistent with bursitis or osteoarthritis flare.  Start diclofenac twice a day, check x-rays.  Icing.  Follow-up if unimproved.  Follow up: August or September for complete physical and follow-up lipidemia Visit date not found  Orders Placed This Encounter  Procedures  . DG Knee AP/LAT W/Sunrise Left  . Comprehensive metabolic panel  . Lipid panel  . Ambulatory referral to  Pulmonology   Meds ordered this encounter  Medications  . atorvastatin (LIPITOR) 10 MG tablet    Sig: Take 1 tablet (10 mg total) by mouth daily.    Dispense:  90 tablet    Refill:  3  . diclofenac (VOLTAREN) 75 MG EC tablet    Sig: Take 1 tablet (75 mg total) by mouth 2 (two) times daily. For 2 weeks, then as needed    Dispense:  30 tablet    Refill:  0      I reviewed the patients updated PMH, FH, and SocHx.    Patient Active Problem List   Diagnosis Date Noted  . Mixed hyperlipidemia 07/11/2018  . Lentigo 04/06/2018  . Colon cancer screening 04/06/2018  . Snoring 04/06/2018  . Obesity (BMI 30-39.9) 04/06/2018   Current Meds  Medication Sig  . [DISCONTINUED] fluticasone (FLONASE) 50 MCG/ACT nasal spray Place 2 sprays into both nostrils daily.  . [DISCONTINUED] meloxicam (MOBIC) 15 MG tablet Take 1 tablet (15 mg total) by mouth daily.  . [DISCONTINUED] predniSONE (DELTASONE) 10 MG tablet Take 4 tabs qd x 2 days, 3 qd x 2 days, 2 qd x 2d, 1qd x 3 days  . [DISCONTINUED] triamcinolone cream (KENALOG) 0.1 % Apply 1 application topically 2 (two) times daily.    Allergies: Patient has No Known Allergies. Family History: Patient  family history includes Alcohol abuse in his father; COPD in his mother; Clotting disorder in his sister; Diabetes in his daughter; Healthy in his son; Kidney disease in his brother; Stroke in his father. Social History:  Patient  reports that he has never smoked. He has never used smokeless tobacco. He reports that he does not drink alcohol or use drugs.  Review of Systems: Constitutional: Negative for fever malaise or anorexia Cardiovascular: negative for chest pain Respiratory: negative for SOB or persistent cough Gastrointestinal: negative for abdominal pain  Objective  Vitals: BP 132/76   Pulse 67   Temp 98 F (36.7 C) (Oral)   Resp 16   Ht 5\' 3"  (1.6 m)   Wt 255 lb (115.7 kg)   SpO2 97%   BMI 45.17 kg/m  General: no acute distress ,  A&Ox3 HEENT: PEERL, conjunctiva normal, Oropharynx moist,neck is supple Cardiovascular:  RRR without murmur or gallop.  Respiratory:  Good breath sounds bilaterally, CTAB with normal respiratory effort Skin:  Warm, no rashes Left knee: Normal-appearing, no warmth or effusions, full range of motion without crepitus.  Medial tenderness present, no patellar tenderness or apprehension.  Normal gait     Commons side effects, risks, benefits, and alternatives for medications and treatment plan prescribed today were discussed, and the patient expressed understanding of the given instructions. Patient is instructed to call or message via MyChart if he/she has any questions or concerns regarding our treatment plan. No barriers to understanding were identified. We discussed Red Flag symptoms and signs in detail. Patient expressed understanding regarding what to do in case of urgent or emergency type symptoms.   Medication list was reconciled, printed and provided to the patient in AVS. Patient instructions and summary information was reviewed with the patient as documented in the AVS. This note was prepared with assistance of Dragon voice recognition software. Occasional wrong-word or sound-a-like substitutions may have occurred due to the inherent limitations of voice recognition software

## 2019-05-31 NOTE — Patient Instructions (Signed)
Please return in late august or September for your annual complete physical; please come fasting.  Start icing your left knee for 10 minutes 2-3x/day.  Take the anti-inflammatory medication for two weeks.  Please restart your lipitor.  We will call you with information regarding your referral appointment. Sleep medicine/study. If you do not hear from [place] within the next 2 weeks, please let me know. It can take 1-2 weeks to get appointments set up with the specialists.    If you have any questions or concerns, please don't hesitate to send me a message via MyChart or call the office at [phone]. Thank you for visiting with [place] today! It's our pleasure caring for you.

## 2019-06-03 NOTE — Progress Notes (Signed)
Please call patient: I have reviewed his/her lab results. Xray shows mild arthritis in the area where he is having pain. Follow instructions given at office visit.

## 2019-06-03 NOTE — Progress Notes (Signed)
Please call patient: I have reviewed his/her lab results. Please call pt and have him restart his lipitor as we discussed at his visit. And ensure he has f/u visi tin 6-8 weeks for cpe and recheck lipids and sugars. Thanks.    The 10-year ASCVD risk score Mikey Bussing DC Brooke Bonito., et al., 2013) is: 21.2%   Values used to calculate the score:     Age: 68 years     Sex: Male     Is Non-Hispanic African American: No     Diabetic: No     Tobacco smoker: No     Systolic Blood Pressure: 794 mmHg     Is BP treated: No     HDL Cholesterol: 32 mg/dL     Total Cholesterol: 192 mg/dL

## 2019-06-29 ENCOUNTER — Other Ambulatory Visit: Payer: Self-pay

## 2019-06-29 ENCOUNTER — Encounter: Payer: Self-pay | Admitting: Family Medicine

## 2019-06-29 ENCOUNTER — Ambulatory Visit (INDEPENDENT_AMBULATORY_CARE_PROVIDER_SITE_OTHER): Payer: Medicare HMO | Admitting: Family Medicine

## 2019-06-29 VITALS — BP 136/88 | HR 59 | Temp 98.0°F | Ht 63.0 in | Wt 257.0 lb

## 2019-06-29 DIAGNOSIS — G8929 Other chronic pain: Secondary | ICD-10-CM

## 2019-06-29 DIAGNOSIS — M25562 Pain in left knee: Secondary | ICD-10-CM

## 2019-06-29 DIAGNOSIS — N528 Other male erectile dysfunction: Secondary | ICD-10-CM | POA: Diagnosis not present

## 2019-06-29 MED ORDER — ACETAMINOPHEN-CODEINE #3 300-30 MG PO TABS: 1.0000 | ORAL_TABLET | Freq: Four times a day (QID) | ORAL | 0 refills | Status: DC | PRN

## 2019-06-29 MED ORDER — SILDENAFIL CITRATE 100 MG PO TABS: ORAL_TABLET | ORAL | 0 refills | Status: DC

## 2019-06-29 MED ORDER — DICLOFENAC SODIUM 1 % TD GEL: 4.0000 g | Freq: Four times a day (QID) | TRANSDERMAL | 1 refills | Status: DC

## 2019-06-29 NOTE — Progress Notes (Signed)
Patient: Roy Harris MRN: [id number] DOB: 06-Aug-1951 PCP: [name], MD     Subjective:  Chief Complaint  Patient presents with  . L knee pain x 2 mos    HPI: The patient is a 68 y.o. male who presents today for left knee pain x 2 months. Saw Dr.[name] in July and xray was done which showed minimal degenerative change at the medial aspect of the knee. Told to ice knee and take NSAIDs x 2 weeks. He states the pain never got better even with the NSAIDs. The NSAID did not help at all. He states the pain is about the same as it was in July. It is constant and now it is difficult for him to walk. Pain is located on medial aspect of left knee and his knee is swelling. He denies any trauma to this knee. Pain rated as an 8/10 and is described as sharp. When he walks pain radiates over entire patellar area. Nothing makes it better, not even non weight bearing activities. Walking and twisting make it worse. Denies any injury to this knee.   Also has complaints of erectile dysfunction. This started about 8 months ago. No precipitating factors. Has had no trauma to pelvic area, no smoking/diabetes/HTN. His relationship is good. Wants to know what he can do for this. He has no problems getting an erection, just maintaining his erection. He has been married 68 years.   Review of Systems  Constitutional: Negative for chills, fatigue and fever.  Respiratory: Positive for apnea. Negative for cough and shortness of breath.   Cardiovascular: Positive for leg swelling. Negative for chest pain and palpitations.  Genitourinary: Negative for discharge, penile pain, scrotal swelling and testicular pain.       Erectile dysfunction  Musculoskeletal: Positive for arthralgias, gait problem (due to knee pain) and joint swelling. Negative for back pain and myalgias.       C/o worsening L knee pain x 2 mos.   Skin: Negative for rash.    Allergies Patient has No Known Allergies.  Past Medical  History Patient  has a past medical history of Mixed hyperlipidemia (07/11/2018).  Surgical History Patient  has no past surgical history on file.  Family History Pateint's family history includes Alcohol abuse in his father; COPD in his mother; Clotting disorder in his sister; Diabetes in his daughter; Healthy in his son; Kidney disease in his brother; Stroke in his father.  Social History Patient  reports that he has never smoked. He has never used smokeless tobacco. He reports that he does not drink alcohol or use drugs.    Objective: Vitals:   06/29/19 0823  Weight: 257 lb (116.6 kg)  Height: 5\' 3"  (1.6 m)    Body mass index is 45.53 kg/m.  Physical Exam Vitals signs reviewed.  Constitutional:      Appearance: He is obese.  HENT:     Head: Normocephalic and atraumatic.  Musculoskeletal:        General: Tenderness (to lateral aspect of left knee. no tenderness over patella. can extend 180 degress and flex past 90 degrees. + pain wiht varus and valgus strain along tibeal line. negative draw signs.  gait with limp ) present.  Neurological:     Mental Status: He is alert.        Assessment/plan:  1. Chronic pain of left knee Xray no acute findings. Failed 6 weeks of conservative therapy with nsaids/brace. MRI to rule out ligament issue or other pathology. Recommended weight  loss, voltaren gel QID, tylenol 3 for severe pain and brace.  - MR Knee Left  Wo Contrast; Future   2. Other male erectile dysfunction Trial of viagra. Weight loss will also help. Discussed side effects of viagra and precautions given for priapism. Would f/u with Dr. Jonni Sanger in 1 month to see if this is helping.     Return in about 1 month (around 07/30/2019) for ED.  Orma Flaming, MD [place]   06/29/2019

## 2019-06-29 NOTE — Patient Instructions (Signed)
For knee pain.... 1) start using voltaren gel four times a day to knee. It's a topical anti inflammatory that you can use instead of taking oral pills and increasing risk of ulcer/bleeding in your belly  2) continue ice/brace 3) tylenol 3 for severe pain. Has codeine in this and may make you drowsy. Please be careful 4) ordering MRI to make sure no tear in ligament or other issue going on...   Erectile dysfunction  Starting 1/2 tab of viagra as needed 30 minutes before sex. Remember can drop your blood pressure and make you dizzy. Also if erection longer than 4 hours must go to ER. It's an emergency.   Weight loss will help both of these issues.   Nice to meet you today!  Dr. Rogers Blocker

## 2019-07-02 ENCOUNTER — Other Ambulatory Visit: Payer: Self-pay | Admitting: Family Medicine

## 2019-07-12 ENCOUNTER — Ambulatory Visit (INDEPENDENT_AMBULATORY_CARE_PROVIDER_SITE_OTHER): Payer: Medicare HMO | Admitting: Pulmonary Disease

## 2019-07-12 ENCOUNTER — Encounter: Payer: Self-pay | Admitting: Pulmonary Disease

## 2019-07-12 VITALS — BP 120/80 | HR 60 | Temp 97.3°F | Ht 66.0 in | Wt 258.2 lb

## 2019-07-12 DIAGNOSIS — R062 Wheezing: Secondary | ICD-10-CM

## 2019-07-12 DIAGNOSIS — R0683 Snoring: Secondary | ICD-10-CM

## 2019-07-12 NOTE — Patient Instructions (Signed)
High probability of obstructive sleep apnea  We will set you up with a home sleep study Call you with findings as soon as we review results  Treatment options as we discussed today  I will see you back in the office in 3 months for follow-up  Call with significant concerns Sleep Apnea Sleep apnea is a condition in which breathing pauses or becomes shallow during sleep. Episodes of sleep apnea usually last 10 seconds or longer, and they may occur as many as 20 times an hour. Sleep apnea disrupts your sleep and keeps your body from getting the rest that it needs. This condition can increase your risk of certain health problems, including:  Heart attack.  Stroke.  Obesity.  Diabetes.  Heart failure.  Irregular heartbeat. What are the causes? There are three kinds of sleep apnea:  Obstructive sleep apnea. This kind is caused by a blocked or collapsed airway.  Central sleep apnea. This kind happens when the part of the brain that controls breathing does not send the correct signals to the muscles that control breathing.  Mixed sleep apnea. This is a combination of obstructive and central sleep apnea. The most common cause of this condition is a collapsed or blocked airway. An airway can collapse or become blocked if:  Your throat muscles are abnormally relaxed.  Your tongue and tonsils are larger than normal.  You are overweight.  Your airway is smaller than normal. What increases the risk? You are more likely to develop this condition if you:  Are overweight.  Smoke.  Have a smaller than normal airway.  Are elderly.  Are male.  Drink alcohol.  Take sedatives or tranquilizers.  Have a family history of sleep apnea. What are the signs or symptoms? Symptoms of this condition include:  Trouble staying asleep.  Daytime sleepiness and tiredness.  Irritability.  Loud snoring.  Morning headaches.  Trouble concentrating.  Forgetfulness.  Decreased  interest in sex.  Unexplained sleepiness.  Mood swings.  Personality changes.  Feelings of depression.  Waking up often during the night to urinate.  Dry mouth.  Sore throat. How is this diagnosed? This condition may be diagnosed with:  A medical history.  A physical exam.  A series of tests that are done while you are sleeping (sleep study). These tests are usually done in a sleep lab, but they may also be done at home. How is this treated? Treatment for this condition aims to restore normal breathing and to ease symptoms during sleep. It may involve managing health issues that can affect breathing, such as high blood pressure or obesity. Treatment may include:  Sleeping on your side.  Using a decongestant if you have nasal congestion.  Avoiding the use of depressants, including alcohol, sedatives, and narcotics.  Losing weight if you are overweight.  Making changes to your diet.  Quitting smoking.  Using a device to open your airway while you sleep, such as: ? An oral appliance. This is a custom-made mouthpiece that shifts your lower jaw forward. ? A continuous positive airway pressure (CPAP) device. This device blows air through a mask when you breathe out (exhale). ? A nasal expiratory positive airway pressure (EPAP) device. This device has valves that you put into each nostril. ? A bi-level positive airway pressure (BPAP) device. This device blows air through a mask when you breathe in (inhale) and breathe out (exhale).  Having surgery if other treatments do not work. During surgery, excess tissue is removed to create a  wider airway. It is important to get treatment for sleep apnea. Without treatment, this condition can lead to:  High blood pressure.  Coronary artery disease.  In men, an inability to achieve or maintain an erection (impotence).  Reduced thinking abilities. Follow these instructions at home: Lifestyle  Make any lifestyle changes that your  health care provider recommends.  Eat a healthy, well-balanced diet.  Take steps to lose weight if you are overweight.  Avoid using depressants, including alcohol, sedatives, and narcotics.  Do not use any products that contain nicotine or tobacco, such as cigarettes, e-cigarettes, and chewing tobacco. If you need help quitting, ask your health care provider. General instructions  Take over-the-counter and prescription medicines only as told by your health care provider.  If you were given a device to open your airway while you sleep, use it only as told by your health care provider.  If you are having surgery, make sure to tell your health care provider you have sleep apnea. You may need to bring your device with you.  Keep all follow-up visits as told by your health care provider. This is important. Contact a health care provider if:  The device that you received to open your airway during sleep is uncomfortable or does not seem to be working.  Your symptoms do not improve.  Your symptoms get worse. Get help right away if:  You develop: ? Chest pain. ? Shortness of breath. ? Discomfort in your back, arms, or stomach.  You have: ? Trouble speaking. ? Weakness on one side of your body. ? Drooping in your face. These symptoms may represent a serious problem that is an emergency. Do not wait to see if the symptoms will go away. Get medical help right away. Call your local emergency services (911 in the [place] Do not drive yourself to the hospital. Summary  Sleep apnea is a condition in which breathing pauses or becomes shallow during sleep.  The most common cause is a collapsed or blocked airway.  The goal of treatment is to restore normal breathing and to ease symptoms during sleep. This information is not intended to replace advice given to you by your health care provider. Make sure you discuss any questions you have with your health care provider. Document Released:  10/15/2002 Document Revised: 08/11/2018 Document Reviewed: 06/20/2018 Elsevier Patient Education  2020 [employer].

## 2019-07-12 NOTE — Progress Notes (Signed)
Roy Harris    IN:[id number]    [birth year]  Primary Care Physician:[name], [name], MD  Referring Physician: [name], [address],  [place] [address]  Chief complaint:   Being seen for snoring, witnessed apneas  HPI:  History of snoring for many years Wakes up with panic episodes Headache and neck ache in the mornings Dryness of his mouth in the mornings Wakes up tired on most days Has been noted to hold his breath for over a minute His memory is good Occasional choking episodes during sleep  Usually goes to bed about 10 PM, takes him about 10 minutes to fall asleep, wakes up about 3-4 times during the night multiple reasons-especially to go to the bathroom Final awakening time about 6 AM  His weight has been steady  He has some shortness of breath and wheezing  Son has OSA  Never smoker  He has occasional shortness of breath with wheezing He does not think he is limited with activities  Pets: No pets Occupation: Retired Airline pilot Exposures: No significant exposure history  Outpatient Encounter Medications as of 07/12/2019  Medication Sig  . acetaminophen-codeine (TYLENOL #3) 300-30 MG tablet Take 1 tablet by mouth every 6 (six) hours as needed for moderate pain or severe pain.  Marland Kitchen atorvastatin (LIPITOR) 10 MG tablet Take 1 tablet (10 mg total) by mouth daily.  . sildenafil (VIAGRA) 100 MG tablet 1/2 tablet 30 minutes before sex as needed.  . diclofenac (VOLTAREN) 75 MG EC tablet Take 1 tablet (75 mg total) by mouth 2 (two) times daily. For 2 weeks, then as needed (Patient not taking: Reported on 07/12/2019)  . diclofenac sodium (VOLTAREN) 1 % GEL Apply 4 g topically 4 (four) times daily. (Patient not taking: Reported on 07/12/2019)   No facility-administered encounter medications on file as of 07/12/2019.     Allergies as of 07/12/2019  . (No Known Allergies)    Past Medical History:  Diagnosis Date  . Mixed  hyperlipidemia 07/11/2018    History reviewed. No pertinent surgical history.  Family History  Problem Relation Age of Onset  . COPD Mother   . Stroke Father   . Alcohol abuse Father   . Clotting disorder Sister   . Diabetes Daughter   . Healthy Son   . Kidney disease Brother   . Cancer Neg Hx     Social History   Socioeconomic History  . Marital status: Married    Spouse name: Not on file  . Number of children: 3  . Years of education: Not on file  . Highest education level: Not on file  Occupational History  . Occupation: [employer], Ambulance person RETIRED  Social Needs  . Financial resource strain: Not on file  . Food insecurity    Worry: Not on file    Inability: Not on file  . Transportation needs    Medical: Not on file    Non-medical: Not on file  Tobacco Use  . Smoking status: Never Smoker  . Smokeless tobacco: Never Used  Substance and Sexual Activity  . Alcohol use: Never    Frequency: Never  . Drug use: Never  . Sexual activity: Yes  Lifestyle  . Physical activity    Days per week: Not on file    Minutes per session: Not on file  . Stress: Not on file  Relationships  . Social connections  Talks on phone: Not on file    Gets together: Not on file    Attends religious service: Not on file    Active member of club or organization: Not on file    Attends meetings of clubs or organizations: Not on file    Relationship status: Not on file  . Intimate partner violence    Fear of current or ex partner: Not on file    Emotionally abused: Not on file    Physically abused: Not on file    Forced sexual activity: Not on file  Other Topics Concern  . Not on file  Social History Narrative   Originally from [place], moved to [place] in 2012; married. 3 children. Family in here in [place] as well    Review of Systems  HENT: Negative.   Eyes: Negative.   Respiratory: Positive for apnea, shortness of breath and wheezing.   Cardiovascular: Negative.    Gastrointestinal: Negative.   Endocrine: Negative.   Psychiatric/Behavioral: Positive for sleep disturbance.  All other systems reviewed and are negative.   Vitals:   07/12/19 0925 07/12/19 0926  BP:  120/80  Pulse:  60  Temp: (!) 97.3 F (36.3 C)   SpO2:  97%     Physical Exam  Constitutional: He appears well-developed and well-nourished.  Obese  HENT:  Head: Normocephalic and atraumatic.  Crowded oropharynx, Mallampati 3  Eyes: Pupils are equal, round, and reactive to light. Conjunctivae are normal. Right eye exhibits no discharge.  Neck: Normal range of motion. Neck supple. No tracheal deviation present. No thyromegaly present.  Cardiovascular: Normal rate, regular rhythm and normal heart sounds.  Pulmonary/Chest: Effort normal and breath sounds normal. No respiratory distress. He has no wheezes.  Abdominal: Soft. Bowel sounds are normal. He exhibits no distension. There is no abdominal tenderness. There is no rebound.   Epworth sleepiness of 6  Assessment:  High probability of significant obstructive sleep apnea -Significant symptoms -Crowded oropharynx -Obesity  Daytime sleepiness  Nonrestorative sleep  Shortness of breath and wheezing -Never smoker -May be related to his obese status  Plan/Recommendations: Pathophysiology of sleep disordered breathing discussed with the patient  Options of treatment discussed with the patient  We will schedule the patient for home sleep study  We will obtain a pulmonary function test on the patient  I will see him back in the office in about 3 months  Encouraged to call with any significant concerns   Sherrilyn Rist MD [facility] 07/12/2019, 9:43 AM  CC: [name], MD

## 2019-07-22 ENCOUNTER — Other Ambulatory Visit: Payer: Self-pay

## 2019-07-22 ENCOUNTER — Ambulatory Visit
Admission: RE | Admit: 2019-07-22 | Discharge: 2019-07-22 | Disposition: A | Discharge status: Home / Self Care | Payer: Medicare HMO | Source: Ambulatory Visit | Attending: Family Medicine | Admitting: Family Medicine

## 2019-07-22 DIAGNOSIS — G8929 Other chronic pain: Secondary | ICD-10-CM

## 2019-07-22 DIAGNOSIS — M25562 Pain in left knee: Secondary | ICD-10-CM

## 2019-07-23 ENCOUNTER — Ambulatory Visit (INDEPENDENT_AMBULATORY_CARE_PROVIDER_SITE_OTHER): Payer: Medicare HMO | Admitting: Family Medicine

## 2019-07-23 ENCOUNTER — Encounter: Payer: Self-pay | Admitting: Family Medicine

## 2019-07-23 VITALS — BP 132/80 | HR 63 | Temp 97.3°F | Resp 16 | Ht 63.0 in | Wt 256.4 lb

## 2019-07-23 DIAGNOSIS — R0683 Snoring: Secondary | ICD-10-CM | POA: Diagnosis not present

## 2019-07-23 DIAGNOSIS — R600 Localized edema: Secondary | ICD-10-CM | POA: Diagnosis not present

## 2019-07-23 DIAGNOSIS — E782 Mixed hyperlipidemia: Secondary | ICD-10-CM

## 2019-07-23 DIAGNOSIS — E1065 Type 1 diabetes mellitus with hyperglycemia: Secondary | ICD-10-CM

## 2019-07-23 DIAGNOSIS — Z23 Encounter for immunization: Secondary | ICD-10-CM

## 2019-07-23 DIAGNOSIS — N528 Other male erectile dysfunction: Secondary | ICD-10-CM

## 2019-07-23 DIAGNOSIS — Z0000 Encounter for general adult medical examination without abnormal findings: Secondary | ICD-10-CM

## 2019-07-23 DIAGNOSIS — G8929 Other chronic pain: Secondary | ICD-10-CM

## 2019-07-23 DIAGNOSIS — R0600 Dyspnea, unspecified: Secondary | ICD-10-CM

## 2019-07-23 DIAGNOSIS — M25562 Pain in left knee: Secondary | ICD-10-CM

## 2019-07-23 LAB — COMPREHENSIVE METABOLIC PANEL
ALT: 21 U/L (ref 0–53)
AST: 15 U/L (ref 0–37)
Albumin: 4.1 g/dL (ref 3.5–5.2)
Alkaline Phosphatase: 68 U/L (ref 39–117)
BUN: 20 mg/dL (ref 6–23)
CO2: 25 mEq/L (ref 19–32)
Calcium: 9.7 mg/dL (ref 8.4–10.5)
Chloride: 106 mEq/L (ref 96–112)
Creatinine, Ser: 0.93 mg/dL (ref 0.40–1.50)
GFR: 80.5 mL/min (ref >60.00)
Glucose, Bld: 111 mg/dL — ABNORMAL HIGH (ref 70–99)
Potassium: 4.2 mEq/L (ref 3.5–5.1)
Sodium: 140 mEq/L (ref 135–145)
Total Bilirubin: 1.1 mg/dL (ref 0.2–1.2)
Total Protein: 6.5 g/dL (ref 6.0–8.3)

## 2019-07-23 LAB — CBC WITH DIFFERENTIAL/PLATELET
Basophils Absolute: 0.1 10*3/uL (ref 0.0–0.1)
Basophils Relative: 0.9 % (ref 0.0–3.0)
Eosinophils Absolute: 0.1 10*3/uL (ref 0.0–0.7)
Eosinophils Relative: 1.6 % (ref 0.0–5.0)
HCT: 44.8 % (ref 39.0–52.0)
Hemoglobin: 14.7 g/dL (ref 13.0–17.0)
Lymphocytes Relative: 25.7 % (ref 12.0–46.0)
Lymphs Abs: 1.5 10*3/uL (ref 0.7–4.0)
MCHC: 32.9 g/dL (ref 30.0–36.0)
MCV: 91.4 fl (ref 78.0–100.0)
Monocytes Absolute: 0.5 10*3/uL (ref 0.1–1.0)
Monocytes Relative: 8 % (ref 3.0–12.0)
Neutro Abs: 3.7 10*3/uL (ref 1.4–7.7)
Neutrophils Relative %: 63.8 % (ref 43.0–77.0)
Platelets: 185 10*3/uL (ref 150.0–400.0)
RBC: 4.9 Mil/uL (ref 4.22–5.81)
RDW: 13.4 % (ref 11.5–15.5)
WBC: 5.9 10*3/uL (ref 4.0–10.5)

## 2019-07-23 LAB — LIPID PANEL
Cholesterol: 158 mg/dL (ref 0–200)
HDL: 34.2 mg/dL — ABNORMAL LOW (ref >39.00)
LDL Cholesterol: 98 mg/dL (ref 0–99)
NonHDL: 123.83
Total CHOL/HDL Ratio: 5
Triglycerides: 130 mg/dL (ref 0.0–149.0)
VLDL: 26 mg/dL (ref 0.0–40.0)

## 2019-07-23 LAB — TSH: TSH: 1.3 u[IU]/mL (ref 0.35–4.50)

## 2019-07-23 MED ORDER — PREVNAR 13 IM SUSP: 0.5000 mL | Freq: Once | INTRAMUSCULAR | 0 refills | Status: DC

## 2019-07-23 MED ORDER — FUROSEMIDE 20 MG PO TABS: 20.0000 mg | ORAL_TABLET | Freq: Every day | ORAL | 3 refills | Status: DC | PRN

## 2019-07-23 MED ORDER — PREVNAR 13 IM SUSP: 0.5000 mL | Freq: Once | INTRAMUSCULAR | 0 refills | Status: AC

## 2019-07-23 MED ORDER — POTASSIUM CHLORIDE CRYS ER 20 MEQ PO TBCR: 20.0000 meq | EXTENDED_RELEASE_TABLET | Freq: Every day | ORAL | 3 refills | Status: DC | PRN

## 2019-07-23 MED ORDER — ASPIRIN EC 81 MG PO TBEC: 81.0000 mg | DELAYED_RELEASE_TABLET | Freq: Every day | ORAL | Status: AC

## 2019-07-23 NOTE — Patient Instructions (Addendum)
Please return in 6 weeks to recheck leg swelling.  Medicare recommends an Annual Wellness Visit for all patients. Please schedule this to be done with our Nurse Educator, Loma Sousa. This is an informative "talk" visit; it's goals are to ensure that your health care needs are being met and to give you education regarding avoiding falls, ensuring you are not suffering from depression or problems with memory or thinking, and to educate you on Advance Care Planning. It helps me take good care of you!  You may use the fluid pill called furosemide as needed to help decrease swelling in the legs. Always take a potassium pill with the lasix. Decrease salt in your diet as well.   We will call you with information regarding your referral appointment. Echocardiogram of your heart.  If you do not hear from [place] within the next 2 weeks, please let me know.  Please take the Prevnar prescription to your pharmacy to get the vaccination.  Today you were given your flu vaccination.   We will be in touch with your about your MRI of the knee results soon.   If you have any questions or concerns, please don't hesitate to send me a message via MyChart or call the office at [phone]. Thank you for visiting with [place] today! It's our pleasure caring for you.   Preventive Care 23 Years and Older, Male Preventive care refers to lifestyle choices and visits with your health care provider that can promote health and wellness. This includes:  A yearly physical exam. This is also called an annual well check.  Regular dental and eye exams.  Immunizations.  Screening for certain conditions.  Healthy lifestyle choices, such as diet and exercise. What can I expect for my preventive care visit? Physical exam Your health care provider will check:  Height and weight. These may be used to calculate body mass index (BMI), which is a measurement that tells if you are at a healthy weight.  Heart rate and blood  pressure.  Your skin for abnormal spots. Counseling Your health care provider may ask you questions about:  Alcohol, tobacco, and drug use.  Emotional well-being.  Home and relationship well-being.  Sexual activity.  Eating habits.  History of falls.  Memory and ability to understand (cognition).  Work and work Statistician. What immunizations do I need?  Influenza (flu) vaccine  This is recommended every year. Tetanus, diphtheria, and pertussis (Tdap) vaccine  You may need a Td booster every 10 years. Varicella (chickenpox) vaccine  You may need this vaccine if you have not already been vaccinated. Zoster (shingles) vaccine  You may need this after age 44. Pneumococcal conjugate (PCV13) vaccine  One dose is recommended after age 68. Pneumococcal polysaccharide (PPSV23) vaccine  One dose is recommended after age 27. Measles, mumps, and rubella (MMR) vaccine  You may need at least one dose of MMR if you were born in 1957 or later. You may also need a second dose. Meningococcal conjugate (MenACWY) vaccine  You may need this if you have certain conditions. Hepatitis A vaccine  You may need this if you have certain conditions or if you travel or work in places where you may be exposed to hepatitis A. Hepatitis B vaccine  You may need this if you have certain conditions or if you travel or work in places where you may be exposed to hepatitis B. Haemophilus influenzae type b (Hib) vaccine  You may need this if you have certain conditions. You may receive  vaccines as individual doses or as more than one vaccine together in one shot (combination vaccines). Talk with your health care provider about the risks and benefits of combination vaccines. What tests do I need? Blood tests  Lipid and cholesterol levels. These may be checked every 5 years, or more frequently depending on your overall health.  Hepatitis C test.  Hepatitis B test. Screening  Lung cancer  screening. You may have this screening every year starting at age 32 if you have a 30-pack-year history of smoking and currently smoke or have quit within the past 15 years.  Colorectal cancer screening. All adults should have this screening starting at age 58 and continuing until age 8. Your health care provider may recommend screening at age 56 if you are at increased risk. You will have tests every 1-10 years, depending on your results and the type of screening test.  Prostate cancer screening. Recommendations will vary depending on your family history and other risks.  Diabetes screening. This is done by checking your blood sugar (glucose) after you have not eaten for a while (fasting). You may have this done every 1-3 years.  Abdominal aortic aneurysm (AAA) screening. You may need this if you are a current or former smoker.  Sexually transmitted disease (STD) testing. Follow these instructions at home: Eating and drinking  Eat a diet that includes fresh fruits and vegetables, whole grains, lean protein, and low-fat dairy products. Limit your intake of foods with high amounts of sugar, saturated fats, and salt.  Take vitamin and mineral supplements as recommended by your health care provider.  Do not drink alcohol if your health care provider tells you not to drink.  If you drink alcohol: ? Limit how much you have to 0-2 drinks a day. ? Be aware of how much alcohol is in your drink. In the [place] one drink equals one 12 oz bottle of beer (355 mL), one 5 oz glass of wine (148 mL), or one 1 oz glass of hard liquor (44 mL). Lifestyle  Take daily care of your teeth and gums.  Stay active. Exercise for at least 30 minutes on 5 or more days each week.  Do not use any products that contain nicotine or tobacco, such as cigarettes, e-cigarettes, and chewing tobacco. If you need help quitting, ask your health care provider.  If you are sexually active, practice safe sex. Use a condom or  other form of protection to prevent STIs (sexually transmitted infections).  Talk with your health care provider about taking a low-dose aspirin or statin. What's next?  Visit your health care provider once a year for a well check visit.  Ask your health care provider how often you should have your eyes and teeth checked.  Stay up to date on all vaccines. This information is not intended to replace advice given to you by your health care provider. Make sure you discuss any questions you have with your health care provider. Document Released: 11/21/2015 Document Revised: 10/19/2018 Document Reviewed: 10/19/2018 Elsevier Patient Education  2020 [employer].

## 2019-07-23 NOTE — Progress Notes (Signed)
Subjective  Chief Complaint  Patient presents with  . Annual Exam    Fasting  . Hyperlipidemia  . Knee Pain    had mri yesterday    HPI: Roy Harris is a 68 y.o. male who presents to [facility] [place] today for a Male Wellness Visit. He also has the concerns and/or needs as listed above in the chief complaint. These will be addressed in addition to the Health Maintenance Visit.   Wellness Visit: annual visit with health maintenance review and exam    Health maintenance: Colorectal cancer screening is up-to-date.  Morbid obesity: Reports has been eating worse due to the cold restrictions, less exercise due to knee pain and has had mild weight gain.  Immunizations: Flu shot today and Prevnar recommended.  He is fasting for blood work today.  He is doing annual wellness visit. Lifestyle: Body mass index is 45.42 kg/m. Wt Readings from Last 3 Encounters:  07/23/19 256 lb 6.4 oz (116.3 kg)  07/12/19 258 lb 3.2 oz (117.1 kg)  06/29/19 257 lb (116.6 kg)     Chronic disease management visit and/or acute problem visit:  Snoring probable sleep apnea: Reviewed recent pulmonology consult.  Home sleep study ordered.  Recommend weight loss  Bilateral lower extremity edema worsening over the last several months: This is a new problem.  Associated with nighttime PND.  He denies chest pain, palpitations, dyspnea on exertion.  He does snore.  He awakens gasping at times.  He has no exertional chest pain but does live a sedentary life.  No history of smoking.  No calf pain.  No claudication  Hyperlipidemia on statin: Here for recheck.  Tolerates a statin well.  Chronic left knee pain: Undergoing evaluation with MRI yesterday.  Awaiting results.  Erectile dysfunction: Start low-dose Viagra.  No significant improvement in function.  He is not interested in further evaluation at this time.  Patient Active Problem List   Diagnosis Date Noted  . Other male erectile  dysfunction 06/29/2019  . Mixed hyperlipidemia 07/11/2018  . Lentigo 04/06/2018  . Colon cancer screening 04/06/2018  . Snoring 04/06/2018  . Morbid obesity ([place]) 04/06/2018   Health Maintenance  Topic Date Due  . PNA vac Low Risk Adult (1 of 2 - PCV13) 04/17/2015  . INFLUENZA VACCINE  06/09/2019  . Fecal DNA (Cologuard)  04/22/2021  . TETANUS/TDAP  07/07/2028  . Hepatitis C Screening  Completed   Immunization History  Administered Date(s) Administered  . Influenza, High Dose Seasonal PF 07/07/2018  . Tdap 07/07/2018   We updated and reviewed the patient's past history in detail and it is documented below. Allergies: Patient has No Known Allergies. Past Medical History  has a past medical history of Mixed hyperlipidemia (07/11/2018). Past Surgical History Patient  has no past surgical history on file. Social History Patient  reports that he has never smoked. He has never used smokeless tobacco. He reports that he does not drink alcohol or use drugs. Family History family history includes Alcohol abuse in his father; COPD in his mother; Clotting disorder in his sister; Diabetes in his daughter; Healthy in his son; Kidney disease in his brother; Stroke in his father. Review of Systems: Constitutional: negative for fever or malaise Ophthalmic: negative for photophobia, double vision or loss of vision Cardiovascular: negative for chest pain, dyspnea on exertion, but positive for new LE swelling as noted above Respiratory: negative for SOB or persistent cough Gastrointestinal: negative for abdominal pain, change in bowel  habits or melena Genitourinary: negative for dysuria or gross hematuria Musculoskeletal: negative for new gait disturbance or muscular weakness, chronic left knee pain persists Integumentary: negative for new or persistent rashes Neurological: negative for TIA or stroke symptoms Psychiatric: negative for SI or delusions Allergic/Immunologic: negative for hives   Patient Care Team    Relationship Specialty Notifications Start End  [name], MD PCP - General Family Medicine  04/06/18   Laurin Coder, MD Consulting Physician Pulmonary Disease  07/23/19    Objective  Vitals: BP 132/80   Pulse 63   Temp (!) 97.3 F (36.3 C) (Tympanic)   Resp 16   Ht 5\' 3"  (1.6 m)   Wt 256 lb 6.4 oz (116.3 kg)   SpO2 96%   BMI 45.42 kg/m  General:  Well developed, well nourished, no acute distress  Psych:  Alert and orientedx3,normal mood and affect HEENT:  Normocephalic, atraumatic, non-icteric sclera, PERRL, oropharynx is clear without mass or exudate, supple neck without adenopathy, mass or thyromegaly Cardiovascular:  Normal S1, S2, RRR without gallop, rub or murmur, nondisplaced PMI, +2 distal pulses in bilateral upper and lower extremities. Respiratory:  Good breath sounds bilaterally, CTAB with normal respiratory effort Gastrointestinal: normal bowel sounds, soft, non-tender, no noted masses. No HSM MSK: no deformities, contusions. Joints are without erythema or swelling.  +2 pitting edema bilateral lower extremities to mid calf, no cords, spine and CVA region are nontender Skin:  Warm, no rashes or suspicious lesions noted Neurologic:    Mental status is normal. CN 2-11 are normal. Gross motor and sensory exams are normal. Stable gait. No tremor GU: No inguinal hernias or adenopathy are appreciated bilaterally   Assessment  1. Annual physical exam   2. Mixed hyperlipidemia   3. Other male erectile dysfunction   4. Chronic pain of left knee   5. Snoring   6. Morbid obesity ([place])   7. Bilateral lower extremity edema   8. PND (paroxysmal nocturnal dyspnea)      Plan  Male Wellness Visit:  Age appropriate Health Maintenance and Prevention measures were discussed with patient. Included topics are cancer screening recommendations, ways to keep healthy (see AVS) including dietary and exercise recommendations, regular eye and dental care, use of  seat belts, and avoidance of moderate alcohol use and tobacco use.   BMI: discussed patient's BMI and encouraged positive lifestyle modifications to help get to or maintain a target BMI.  HM needs and immunizations were addressed and ordered. See below for orders. See HM and immunization section for updates.  Flu shot given today, prescription for Prevnar recommended  Routine labs and screening tests ordered including cmp, cbc and lipids where appropriate.  Discussed recommendations regarding Vit D and calcium supplementation (see AVS)  Chronic disease f/u and/or acute problem visit: (deemed necessary to be done in addition to the wellness visit):  Extremity edema with PND: Normal EKG today.  Check echocardiogram and lab work.  Start Lasix with potassium.  Question diastolic dysfunction.  Start baby aspirin work-up ongoing  Sleep apnea for home sleep study.  Hyperlipidemia for recheck.  Continue statin.  Chronic medial knee pain: Await MRI report.  Recommend weight loss  ED: We will follow for now.  Patient not really sexually active.  Will readdress when he is ready if he wants.  Follow up: Return in about 6 weeks (around 09/03/2019) for recheck leg swelling.   Commons side effects, risks, benefits, and alternatives for medications and treatment plan prescribed today were  discussed, and the patient expressed understanding of the given instructions. Patient is instructed to call or message via MyChart if he/she has any questions or concerns regarding our treatment plan. No barriers to understanding were identified. We discussed Red Flag symptoms and signs in detail. Patient expressed understanding regarding what to do in case of urgent or emergency type symptoms.   Medication list was reconciled, printed and provided to the patient in AVS. Patient instructions and summary information was reviewed with the patient as documented in the AVS. This note was prepared with assistance of Dragon  voice recognition software. Occasional wrong-word or sound-a-like substitutions may have occurred due to the inherent limitations of voice recognition software  Orders Placed This Encounter  Procedures  . CBC with Differential/Platelet  . Comprehensive metabolic panel  . Lipid panel  . TSH  . EKG 12-Lead  . ECHOCARDIOGRAM COMPLETE   Meds ordered this encounter  Medications  . DISCONTD: pneumococcal 13-valent conjugate vaccine (PREVNAR 13) SUSP injection    Sig: Inject 0.5 mLs into the muscle once for 1 dose.    Dispense:  0.5 mL    Refill:  0  . furosemide (LASIX) 20 MG tablet    Sig: Take 1 tablet (20 mg total) by mouth daily as needed (leg swelling). Take with potassium    Dispense:  30 tablet    Refill:  3  . potassium chloride SA (K-DUR) 20 MEQ tablet    Sig: Take 1 tablet (20 mEq total) by mouth daily as needed (leg swelling). Take with Lasix    Dispense:  30 tablet    Refill:  3  . aspirin EC 81 MG tablet    Sig: Take 1 tablet (81 mg total) by mouth daily.  . pneumococcal 13-valent conjugate vaccine (PREVNAR 13) SUSP injection    Sig: Inject 0.5 mLs into the muscle once for 1 dose.    Dispense:  0.5 mL    Refill:  0

## 2019-07-23 NOTE — Progress Notes (Signed)
Please call patient: I have reviewed his/her MRI results. Please him know he has a complex cartilage tear in his knee and osteoarthritis.  His options are: return for steroid injection here and start PT OR refer to ortho.

## 2019-07-24 ENCOUNTER — Other Ambulatory Visit (INDEPENDENT_AMBULATORY_CARE_PROVIDER_SITE_OTHER): Payer: Medicare HMO

## 2019-07-24 ENCOUNTER — Encounter: Payer: Self-pay | Admitting: Family Medicine

## 2019-07-24 DIAGNOSIS — R7303 Prediabetes: Secondary | ICD-10-CM

## 2019-07-24 DIAGNOSIS — R739 Hyperglycemia, unspecified: Secondary | ICD-10-CM

## 2019-07-24 HISTORY — DX: Prediabetes: R73.03

## 2019-07-24 LAB — HEMOGLOBIN A1C: Hgb A1c MFr Bld: 6.2 % (ref 4.6–6.5)

## 2019-07-24 NOTE — Progress Notes (Signed)
Please add on hgba1c, dx: hyperglycemia Thanks, Dr. Anav Lammert '

## 2019-07-24 NOTE — Addendum Note (Signed)
Addended by: Francis Dowse T on: 07/24/2019 08:47 AM   Modules accepted: Orders

## 2019-07-25 ENCOUNTER — Ambulatory Visit (INDEPENDENT_AMBULATORY_CARE_PROVIDER_SITE_OTHER): Payer: Medicare HMO | Admitting: Family Medicine

## 2019-07-25 ENCOUNTER — Other Ambulatory Visit: Payer: Self-pay

## 2019-07-25 ENCOUNTER — Encounter: Payer: Self-pay | Admitting: Family Medicine

## 2019-07-25 VITALS — BP 118/76 | HR 70 | Temp 97.9°F | Resp 16 | Ht 63.0 in | Wt 256.4 lb

## 2019-07-25 DIAGNOSIS — S83232D Complex tear of medial meniscus, current injury, left knee, subsequent encounter: Secondary | ICD-10-CM | POA: Diagnosis not present

## 2019-07-25 DIAGNOSIS — M1712 Unilateral primary osteoarthritis, left knee: Secondary | ICD-10-CM | POA: Insufficient documentation

## 2019-07-25 DIAGNOSIS — S83232A Complex tear of medial meniscus, current injury, left knee, initial encounter: Secondary | ICD-10-CM | POA: Insufficient documentation

## 2019-07-25 NOTE — Patient Instructions (Signed)
Please follow up if symptoms do not improve or as needed.  You may where a knee brace/support if it helps the pain as well.   We will call you with information regarding your referral appointment. Physical Therapy.  If you do not hear from [place] within the next 2 weeks, please let me know. It can take 1-2 weeks to get appointments set up with the specialists.   You had a steroid injection today.   Things to be aware of after this injection are listed below:  You may experience no significant improvement or even a slight worsening in your symptoms during the first 24 to 48 hours.  After that we expect your symptoms to improve gradually over the next 2 weeks for the medicine to have its maximal effect.  You should continue to have improvement out to 6 weeks after your injection.  I recommend icing the site of the injection for 20 minutes  1-2 times the day of your injection  You may shower but no swimming, tub bath or Jacuzzi for 24 hours.  If your bandage falls off this does not need to be replaced.  It is appropriate to remove the bandage after 4 hours.  You may resume light activities as tolerated.     POSSIBLE PROCEDURE SIDE EFFECTS: The side effects of the injection are usually fairly minimal however if you may experience some of the following side effects that are usually self-limited and will is off on their own.  If you are concerned please feel free to call the office with questions:             Increased numbness or tingling             Nausea or vomiting             Swelling or bruising at the injection site    Please call our office if if you experience any of the following symptoms over the next week as these can be signs of infection:              Fever greater than 100.6F             Significant swelling at the injection site             Significant redness or drainage from the injection site

## 2019-07-25 NOTE — Progress Notes (Signed)
Subjective  CC:  Chief Complaint  Patient presents with  . Knee Pain/ Injection    Left knee pain and cartlidge tear; returns for steroid injection    HPI: Roy Harris is a 68 y.o. male who presents to the office today to address the problems listed above in the chief complaint.  He was recently seen for persistent left knee pain: c/o left medial knee pain that is unchanged with prior treatments. Pain with climbing stairs. Dull ache at other times as well. No locking or giveway. No trauma.  I reviewed the MRI findings. Pt has complex medial meniscus tear and tricompartmental OA. He is having pain that impedes his quality of life and activity level. See past notes for further documentation. No contraindications to steroids or Synvisc are identified. We have used alternative measures for pain control including tylenol, nsaids, ice, rest, and other prescribed analgesics with variable success.   I reviewed the patients updated PMH, FH, and SocHx.    Patient Active Problem List   Diagnosis Date Noted  . Tricompartment osteoarthritis of left knee 07/25/2019  . Complex tear of medial meniscus of left knee as current injury 07/25/2019  . Prediabetes 07/24/2019  . Other male erectile dysfunction 06/29/2019  . Mixed hyperlipidemia 07/11/2018  . Lentigo 04/06/2018  . Colon cancer screening 04/06/2018  . Snoring 04/06/2018  . Morbid obesity ([place]) 04/06/2018   No outpatient medications have been marked as taking for the 07/25/19 encounter (Office Visit) with [name], MD.    Allergies: Patient has No Known Allergies.  Review of Systems: Constitutional: Negative for fever malaise or anorexia Cardiovascular: negative for chest pain Respiratory: negative for SOB or persistent cough Gastrointestinal: negative for abdominal pain  Objective  Vitals: BP 118/76   Pulse 70   Temp 97.9 F (36.6 C) (Tympanic)   Resp 16   Ht 5\' 3"  (1.6 m)   Wt 256 lb 6.3 oz (116.3 kg)   SpO2  96%   BMI 45.42 kg/m  General: no acute distress , A&Ox3 Cardiovascular:  RRR without murmur  Knee:  Left knee; mild effusion w/o warmth or redness. + medial joint line ttp. Neg mcmurray's. Neg lachman. + crepitus. Skin:  Warm, no rashes  Procedure note for Knee Joint Aspiration and/or Injection:  Indications: pain relief, failed conservative therapies  Knee Arthrocentesis with Injection Procedure Note  Pre-operative Diagnosis: left complex medial meniscus tear and tricompartmental OA of the knee  Post-operative Diagnosis: same  Indications: relief of pain  Anesthesia: Lidocaine 1% without epinephrine without added sodium bicarbonate  Procedure Details   Verbal consent was obtained for the procedure. Universal time out taken.  The Knee joint was prepped with alcohol and an 25 gauge needle was inserted into the joint from the lateral approach.  Four ml 1% lidocaine and one ml of triamcinolone (KENALOG) 40mg /ml was then injected into the joint through the same needle. The needle was removed and the area cleansed and dressed.  Complications:  None; patient tolerated the procedure well.   Assessment  1. Complex tear of medial meniscus of left knee as current injury, subsequent encounter   2. Tricompartment osteoarthritis of left knee      Plan   S/p intra-articular knee joint steroid injection:  Routine post procedure care discussed. Rest, Ice, Nsaids as needed and ROM exercises recommended. F/u care printed out for patient in AVS. Order PT.   Discussed urgent f/u if develops increased pain, redness or fever.   Follow up: as scheduled  Commons side effects, risks, benefits, and alternatives for medications and treatment plan prescribed today were discussed, and the patient expressed understanding of the given instructions. Patient is instructed to call or message via MyChart if he/she has any questions or concerns regarding our treatment plan. No barriers to understanding were  identified. We discussed Red Flag symptoms and signs in detail. Patient expressed understanding regarding what to do in case of urgent or emergency type symptoms.   Medication list was reconciled, printed and provided to the patient in AVS. Patient instructions and summary information was reviewed with the patient as documented in the AVS. This note was prepared with assistance of Dragon voice recognition software. Occasional wrong-word or sound-a-like substitutions may have occurred due to the inherent limitations of voice recognition software  Orders Placed This Encounter  Procedures  . Ambulatory referral to Physical Therapy   No orders of the defined types were placed in this encounter.

## 2019-07-26 MED ORDER — TRIAMCINOLONE ACETONIDE 40 MG/ML IJ SUSP
20.0000 mg | Freq: Once | INTRAMUSCULAR | Status: AC
  Administered 2019-07-25: 20 mg via INTRA_ARTICULAR

## 2019-07-26 NOTE — Addendum Note (Signed)
Addended by: Layla Barter on: 07/26/2019 01:40 PM   Modules accepted: Orders

## 2019-08-01 ENCOUNTER — Other Ambulatory Visit (HOSPITAL_COMMUNITY): Payer: Medicare HMO

## 2019-08-06 ENCOUNTER — Ambulatory Visit: Payer: Medicare HMO | Admitting: Physical Therapy

## 2019-08-06 ENCOUNTER — Encounter: Payer: Self-pay | Admitting: Physical Therapy

## 2019-08-06 ENCOUNTER — Other Ambulatory Visit: Payer: Self-pay

## 2019-08-06 DIAGNOSIS — M25562 Pain in left knee: Secondary | ICD-10-CM

## 2019-08-06 NOTE — Therapy (Addendum)
[facility] [place], [place], [address] Phone: [phone]   Fax:  [phone]  Physical Therapy Evaluation  Patient Details  Name: Roy Harris MRN: [id number] Date of Birth: Nov 12, 1950 Referring Provider (PT): Billey Chang   Encounter Date: 08/06/2019  PT End of Session - 08/06/19 1446    Visit Number  1    Number of Visits  1    Authorization Type  [employer]    PT Start Time  1425    PT Stop Time  1455    PT Time Calculation (min)  30 min    Activity Tolerance  Patient tolerated treatment well    Behavior During Therapy  [facility] for tasks assessed/performed       Past Medical History:  Diagnosis Date  . Mixed hyperlipidemia 07/11/2018  . Prediabetes 07/24/2019   A1c 6.2 9/020    History reviewed. No pertinent surgical history.  There were no vitals filed for this visit.   Subjective Assessment - 08/06/19 1423    Subjective  Pain 2 months ago, pain at medial knee. + MRI for complex medial meniscal tear. Had recent injection, now states no pain. mostly retired. Was having significant pain and difficulty sleeping, now doing better. States no pain at all today.    Currently in Pain?  No/denies         [facility] PT Assessment - 08/06/19 0001      Assessment   Medical Diagnosis  L knee pain    Referring Provider (PT)  Billey Chang    Onset Date/Surgical Date  05/09/19    Prior Therapy  no      Balance Screen   Has the patient fallen in the past 6 months  No      Prior Function   Level of Independence  Independent      Cognition   Overall Cognitive Status  Within Functional Limits for tasks assessed      ROM / Strength   AROM / PROM / Strength  AROM;Strength      AROM   Overall AROM Comments  WNL      Strength   Overall Strength Comments  WNL      Palpation   Palpation comment  No pain to palpate L medial or posterior knee       Ambulation/Gait   Gait Pattern  Within Functional Limits                 Objective measurements completed on examination: See above findings.      [facility] Adult PT Treatment/Exercise - 08/06/19 0001      Exercises   Exercises  Knee/Hip      Knee/Hip Exercises: Standing   Heel Raises  10 reps;Both    Hip Flexion  10 reps;Both;Knee bent      Knee/Hip Exercises: Seated   Long Arc Quad  10 reps;Both      Knee/Hip Exercises: Supine   Straight Leg Raises  10 reps;Both      Knee/Hip Exercises: Sidelying   Hip ABduction  10 reps;Both             PT Education - 08/06/19 1548    Education Details  PT POC, HEP, activities to avoid for L knee pain/meniscus involvement.    Person(s) Educated  Patient    Methods  Explanation;Handout;Demonstration;Tactile cues;Verbal cues    Comprehension  Verbalized understanding;Returned demonstration;Verbal cues required;Tactile cues required       PT Short Term Goals -  08/06/19 1628      PT SHORT TERM GOAL #1   Title  Pt to be independent with HEP    Status  Achieved    Target Date  08/06/19                Plan - 08/06/19 1630    Clinical Impression Statement  Pt states previous pain in L knee, but has no pain since injection. Pt with full ROM, and full Strength. He has no gait or balance deficits. Pt with no functional activity limitations at this time. Discussed HEP, and activities to avoid for meniscal tear. Pt states understanding. Also discussed that if pain returns he will likely need follow up for PT or to see Ortho. Pt not in need of PT at this time.    Personal Factors and Comorbidities  Comorbidity 1    Comorbidities  Complex tear of meniscus    Stability/Clinical Decision Making  Stable/Uncomplicated    Clinical Decision Making  Low    Rehab Potential  Good    PT Frequency  One time visit    PT Treatment/Interventions  ADLs/Self Care Home Management;Cryotherapy;Electrical Stimulation;Gait training;Stair training;Functional mobility training;Therapeutic  activities;Patient/family education;Neuromuscular re-education;Therapeutic exercise;Manual techniques;Passive range of motion    Consulted and Agree with Plan of Care  Patient       Patient will benefit from skilled therapeutic intervention in order to improve the following deficits and impairments:     Visit Diagnosis: Acute pain of left knee     Problem List Patient Active Problem List   Diagnosis Date Noted  . Tricompartment osteoarthritis of left knee 07/25/2019  . Complex tear of medial meniscus of left knee as current injury 07/25/2019  . Prediabetes 07/24/2019  . Other male erectile dysfunction 06/29/2019  . Mixed hyperlipidemia 07/11/2018  . Lentigo 04/06/2018  . Colon cancer screening 04/06/2018  . Snoring 04/06/2018  . Morbid obesity ([place]) 04/06/2018    Lyndee Hensen, PT, DPT 4:33 PM  08/06/19    [facility] [place], [place], [address] Phone: [phone]   Fax:  [phone]  Name: Roy Harris MRN: [id number] Date of Birth: [birth year]   PHYSICAL THERAPY DISCHARGE SUMMARY Visits : 1   Plan: Patient agrees to discharge.  Patient goals were met. Patient is being discharged due to meeting the stated rehab goals.  ?????     Pt not in need of PT at this time.   Lyndee Hensen, PT, DPT 10:51 AM  11/12/19

## 2019-08-06 NOTE — Patient Instructions (Signed)
Access Code: [email or web address]  URL: [email or web address]  Date: 08/06/2019  Prepared by: Lyndee Hensen   Exercises Sidelying Hip Abduction - 10 reps - 2 sets - 1x daily Straight Leg Raise - 10 reps - 2 sets - 1x daily Seated Knee Extension AROM - 10 reps - 2 sets - 1x daily Standing Heel Raise - 10 reps - 2 sets - 1x daily Standing Marching - 10 reps - 2 sets - 1x daily

## 2019-08-07 ENCOUNTER — Ambulatory Visit (HOSPITAL_COMMUNITY): Payer: Medicare HMO | Attending: Cardiology

## 2019-08-07 DIAGNOSIS — R0683 Snoring: Secondary | ICD-10-CM | POA: Diagnosis not present

## 2019-08-07 DIAGNOSIS — R0600 Dyspnea, unspecified: Secondary | ICD-10-CM | POA: Insufficient documentation

## 2019-08-07 DIAGNOSIS — R600 Localized edema: Secondary | ICD-10-CM | POA: Diagnosis not present

## 2019-08-08 ENCOUNTER — Encounter: Payer: Self-pay | Admitting: Family Medicine

## 2019-08-08 ENCOUNTER — Encounter: Payer: Self-pay | Admitting: *Deleted

## 2019-08-08 ENCOUNTER — Other Ambulatory Visit: Payer: Self-pay

## 2019-08-08 ENCOUNTER — Ambulatory Visit: Payer: Medicare HMO

## 2019-08-08 DIAGNOSIS — G4733 Obstructive sleep apnea (adult) (pediatric): Secondary | ICD-10-CM | POA: Diagnosis not present

## 2019-08-08 DIAGNOSIS — I519 Heart disease, unspecified: Secondary | ICD-10-CM

## 2019-08-08 DIAGNOSIS — R0683 Snoring: Secondary | ICD-10-CM

## 2019-08-08 DIAGNOSIS — I5189 Other ill-defined heart diseases: Secondary | ICD-10-CM | POA: Insufficient documentation

## 2019-08-08 HISTORY — DX: Heart disease, unspecified: I51.9

## 2019-08-08 NOTE — Progress Notes (Signed)
Please call patient: I have reviewed his/her lab results. Ultrasound of heart looks good overall. It does show changes that are causing the leg swelling. There are no other concerning findings. The medications should be helping.  Dx: diastolic dysfunction. PL updated.

## 2019-08-09 ENCOUNTER — Telehealth: Payer: Self-pay | Admitting: Pulmonary Disease

## 2019-08-09 DIAGNOSIS — G4733 Obstructive sleep apnea (adult) (pediatric): Secondary | ICD-10-CM | POA: Diagnosis not present

## 2019-08-09 NOTE — Telephone Encounter (Signed)
ATC Patient.  LM for Patient to call back for sleep study results. DME order pending until Patient notified of results.  Dr. Ander Slade has reviewed the home sleep test this showed mild obstructive sleep apnea.   Recommendations   Treatment options are CPAP with the settings auto 5 to 15 will be appropriate.    Close clinical follow up with compliance monitoring to optimize therapeutic efficiency.  Weight loss measures .   Advise against driving while sleepy & against medication with sedative side effects.    Make appointment for 3 months for compliance with download with Dr. Ander Slade.

## 2019-08-10 NOTE — Telephone Encounter (Signed)
Pt returning call.  [phone].

## 2019-08-10 NOTE — Telephone Encounter (Signed)
ATC patient.  LM to call back for sleep study results.

## 2019-08-10 NOTE — Telephone Encounter (Signed)
ATC Patient.  LM to call back for sleep results. 

## 2019-08-13 NOTE — Telephone Encounter (Signed)
ATC Patient.  LM to call back for sleep results. 

## 2019-08-16 NOTE — Telephone Encounter (Signed)
ATC patient with sleep results. LM for Patient to call back tomorrow, 08/17/19 when available.

## 2019-08-23 NOTE — Telephone Encounter (Signed)
ATC patient. LM to call back tomorrow between 0830-1700 for sleep results.

## 2019-08-27 ENCOUNTER — Encounter: Payer: Self-pay | Admitting: *Deleted

## 2019-08-27 NOTE — Telephone Encounter (Signed)
ATC Patient. I have been unable to contact Patient after several attempts for his sleep results from Dr. Ander Slade. Letter has been sent to Patient requesting he contact [facility] for Dr. Judson Roch results and recommendations. Will close this encounter per result protocol.

## 2019-10-22 ENCOUNTER — Telehealth: Payer: Self-pay | Admitting: Family Medicine

## 2019-10-22 NOTE — Telephone Encounter (Signed)
I left a message asking the patient to call and schedule Medicare AWV with Courtney ([facility]).  If patient calls back, please schedule Medicare Wellness Visit (initial) at next available opening.  VDM (Dee-Dee) 

## 2019-11-26 DIAGNOSIS — Z03818 Encounter for observation for suspected exposure to other biological agents ruled out: Secondary | ICD-10-CM | POA: Diagnosis not present

## 2019-12-22 ENCOUNTER — Ambulatory Visit: Payer: Medicare HMO | Attending: Internal Medicine

## 2019-12-22 DIAGNOSIS — Z23 Encounter for immunization: Secondary | ICD-10-CM | POA: Insufficient documentation

## 2019-12-22 NOTE — Progress Notes (Signed)
   Covid-19 Vaccination Clinic  Name:  Roy Harris    MRN: [id number] DOB: 01-04-1951  12/22/2019  [name] was observed post Covid-19 immunization for 15 minutes without incidence. He was provided with Vaccine Information Sheet and instruction to access the V-Safe system.   [name] was instructed to call 911 with any severe reactions post vaccine: Marland Kitchen Difficulty breathing  . Swelling of your face and throat  . A fast heartbeat  . A bad rash all over your body  . Dizziness and weakness    Immunizations Administered    Name Date Dose VIS Date Route   Pfizer COVID-19 Vaccine 12/22/2019  8:28 AM 0.3 mL 10/19/2019 Intramuscular   Manufacturer: [place]   Lot: [id number]   [place]: [id number]

## 2020-01-13 ENCOUNTER — Ambulatory Visit: Payer: Medicare HMO

## 2020-01-15 ENCOUNTER — Ambulatory Visit: Payer: Medicare HMO | Attending: Internal Medicine

## 2020-01-15 DIAGNOSIS — Z23 Encounter for immunization: Secondary | ICD-10-CM | POA: Insufficient documentation

## 2020-01-15 NOTE — Progress Notes (Signed)
   Covid-19 Vaccination Clinic  Name:  Roy Harris    MRN: [id number] DOB: May 13, 1951  01/15/2020  Roy Harris was observed post Covid-19 immunization for 15 minutes without incident. He was provided with Vaccine Information Sheet and instruction to access the V-Safe system.   Roy Harris was instructed to call 911 with any severe reactions post vaccine: Marland Kitchen Difficulty breathing  . Swelling of face and throat  . A fast heartbeat  . A bad rash all over body  . Dizziness and weakness   Immunizations Administered    Name Date Dose VIS Date Route   Pfizer COVID-19 Vaccine 01/15/2020  5:03 PM 0.3 mL 10/19/2019 Intramuscular   Manufacturer: [place]   Lot: [id number]   [place]: [id number]

## 2020-01-21 ENCOUNTER — Ambulatory Visit (INDEPENDENT_AMBULATORY_CARE_PROVIDER_SITE_OTHER): Payer: Medicare HMO

## 2020-01-21 ENCOUNTER — Other Ambulatory Visit: Payer: Self-pay

## 2020-01-21 ENCOUNTER — Other Ambulatory Visit: Payer: Self-pay | Admitting: Family Medicine

## 2020-01-21 DIAGNOSIS — Z0000 Encounter for general adult medical examination without abnormal findings: Secondary | ICD-10-CM

## 2020-01-21 MED ORDER — FUROSEMIDE 20 MG PO TABS: 20.0000 mg | ORAL_TABLET | Freq: Every day | ORAL | 0 refills | Status: DC | PRN

## 2020-01-21 MED ORDER — ATORVASTATIN CALCIUM 10 MG PO TABS: 10.0000 mg | ORAL_TABLET | Freq: Every day | ORAL | 0 refills | Status: DC

## 2020-01-21 MED ORDER — POTASSIUM CHLORIDE CRYS ER 20 MEQ PO TBCR: 20.0000 meq | EXTENDED_RELEASE_TABLET | Freq: Every day | ORAL | 0 refills | Status: DC | PRN

## 2020-01-21 NOTE — Progress Notes (Signed)
This visit is being conducted via phone call due to the COVID-19 pandemic. This patient has given me verbal consent via phone to conduct this visit, patient states they are participating from their home address. Some vital signs may be absent or patient reported.   Patient identification: identified by name, DOB, and current address.  Location provider: [facility], Office Persons participating in the virtual visit: Denman George LPN, patient, and Dr. Billey Chang   Subjective:   Roy Harris is a 69 y.o. male who presents for an Initial Medicare Annual Wellness Visit.  Review of Systems   Cardiac Risk Factors include: advanced age (>67men, >1 women);male gender;dyslipidemia   Objective:    There were no vitals filed for this visit. There is no height or weight on file to calculate BMI.  Advanced Directives 01/21/2020 08/06/2019  Does Patient Have a Medical Advance Directive? No No  Would patient like information on creating a medical advance directive? Yes (MAU/Ambulatory/Procedural Areas - Information given) No - Patient declined    Current Medications (verified) Outpatient Encounter Medications as of 01/21/2020  Medication Sig  . acetaminophen-codeine (TYLENOL #3) 300-30 MG tablet Take 1 tablet by mouth every 6 (six) hours as needed for moderate pain or severe pain.  Marland Kitchen aspirin EC 81 MG tablet Take 1 tablet (81 mg total) by mouth daily.  . sildenafil (VIAGRA) 100 MG tablet 1/2 tablet 30 minutes before sex as needed.  Marland Kitchen atorvastatin (LIPITOR) 10 MG tablet Take 1 tablet (10 mg total) by mouth daily. (Patient not taking: Reported on 01/21/2020)  . furosemide (LASIX) 20 MG tablet Take 1 tablet (20 mg total) by mouth daily as needed (leg swelling). Take with potassium (Patient not taking: Reported on 01/21/2020)  . potassium chloride SA (K-DUR) 20 MEQ tablet Take 1 tablet (20 mEq total) by mouth daily as needed (leg swelling). Take with Lasix (Patient not taking: Reported on  01/21/2020)   No facility-administered encounter medications on file as of 01/21/2020.    Allergies (verified) Patient has no known allergies.   History: Past Medical History:  Diagnosis Date  . Left ventricular diastolic dysfunction [id number]   Bilateral LE edema; Echocardiogram 07/2019  . Mixed hyperlipidemia 07/11/2018  . Prediabetes 07/24/2019   A1c 6.2 9/020   History reviewed. No pertinent surgical history. Family History  Problem Relation Age of Onset  . COPD Mother   . Stroke Father   . Alcohol abuse Father   . Clotting disorder Sister   . Diabetes Daughter   . Healthy Son   . Kidney disease Brother   . Cancer Neg Hx    Social History   Socioeconomic History  . Marital status: Married    Spouse name: Not on file  . Number of children: 3  . Years of education: Not on file  . Highest education level: Not on file  Occupational History  . Occupation: [employer], Ambulance person RETIRED  Tobacco Use  . Smoking status: Never Smoker  . Smokeless tobacco: Never Used  Substance and Sexual Activity  . Alcohol use: Never  . Drug use: Never  . Sexual activity: Yes  Other Topics Concern  . Not on file  Social History Narrative   Originally from [place], moved to [place] in 2012; married. 3 children. Family in here in [place] as well   Social Determinants of Health   Financial Resource Strain:   . Difficulty of Paying Living Expenses:   Food Insecurity:   . Worried About [employer] of  Food in the Last Year:   . [place] in the Last Year:   Transportation Needs:   . Film/video editor (Medical):   Marland Kitchen Lack of Transportation (Non-Medical):   Physical Activity:   . Days of Exercise per Week:   . Minutes of Exercise per Session:   Stress:   . Feeling of Stress :   Social Connections:   . Frequency of Communication with Friends and Family:   . Frequency of Social Gatherings with Friends and Family:   . Attends Religious Services:   . Active Member of  Clubs or Organizations:   . Attends Archivist Meetings:   Marland Kitchen Marital Status:    Tobacco Counseling Counseling given: Not Answered   Clinical Intake:  Pre-visit preparation completed: Yes  Pain : No/denies pain  Diabetes: No  How often do you need to have someone help you when you read instructions, pamphlets, or other written materials from your doctor or pharmacy?: 1 - Never  Interpreter Needed?: No  Information entered by :: Denman George LPN  Activities of Daily Living In your present state of health, do you have any difficulty performing the following activities: 01/21/2020 07/23/2019  Hearing? N N  Vision? N Y  Difficulty concentrating or making decisions? N N  Walking or climbing stairs? N N  Dressing or bathing? N N  Doing errands, shopping? N N  Preparing Food and eating ? N -  Using the Toilet? N -  In the past six months, have you accidently leaked urine? N -  Do you have problems with loss of bowel control? N -  Managing your Medications? N -  Managing your Finances? N -  Housekeeping or managing your Housekeeping? N -  Some recent data might be hidden     Immunizations and Health Maintenance Immunization History  Administered Date(s) Administered  . Fluad Quad(high Dose 65+) 07/23/2019  . Influenza, High Dose Seasonal PF 07/07/2018  . PFIZER SARS-COV-2 Vaccination 12/22/2019, 01/15/2020  . Tdap 07/07/2018   Health Maintenance Due  Topic Date Due  . URINE MICROALBUMIN  Never done  . PNA vac Low Risk Adult (1 of 2 - PCV13) Never done    Patient Care Team: [name], MD as PCP - General (Family Medicine) Laurin Coder, MD as Consulting Physician (Pulmonary Disease)  Indicate any recent Medical Services you may have received from other than [facility] providers in the past year (date may be approximate).    Assessment:   This is a routine wellness examination for [place].  Hearing/Vision screen No exam data present  Dietary  issues and exercise activities discussed: Current Exercise Habits: Home exercise routine, Type of exercise: walking(outside activities), Time (Minutes): 30, Frequency (Times/Week): 4, Weekly Exercise (Minutes/Week): 120, Intensity: Mild  Goals   None    Depression Screen PHQ 2/9 Scores 01/21/2020 07/23/2019 04/06/2018 04/06/2018  PHQ - 2 Score 0 0 0 0  PHQ- 9 Score - - 0 0    Fall Risk Fall Risk  01/21/2020 07/23/2019 04/06/2018  Falls in the past year? 0 0 No  Number falls in past yr: 0 0 -  Injury with Fall? 0 0 -  Follow up Falls evaluation completed;Education provided;Falls prevention discussed Falls evaluation completed -    Is the patient's home free of loose throw rugs in walkways, pet beds, electrical cords, etc?   yes      Grab bars in the bathroom? yes      Handrails on  the stairs?   yes      Adequate lighting?   yes  Cognitive Function:     6CIT Screen 01/21/2020  What Year? 0 points  What month? 0 points  What time? 0 points  Count back from 20 0 points  Months in reverse 0 points  Repeat phrase 2 points  Total Score 2    Screening Tests Health Maintenance  Topic Date Due  . URINE MICROALBUMIN  Never done  . PNA vac Low Risk Adult (1 of 2 - PCV13) Never done  . Fecal DNA (Cologuard)  04/22/2021  . TETANUS/TDAP  07/07/2028  . INFLUENZA VACCINE  Completed  . Hepatitis C Screening  Completed    Qualifies for Shingles Vaccine? Discussed and patient will check with pharmacy for coverage.  Patient education handout provided   Cancer Screenings: Lung: Low Dose CT Chest recommended if Age 61-80 years, 30 pack-year currently smoking OR have quit w/in 15years. Patient does not qualify. Colorectal: Cologuard normal 05/01/18     Plan:  I have personally reviewed and addressed the Medicare Annual Wellness questionnaire and have noted the following in the patient's chart:  A. Medical and social history B. Use of alcohol, tobacco or illicit drugs  C. Current medications  and supplements D. Functional ability and status E.  Nutritional status F.  Physical activity G. Advance directives H. List of other physicians I.  Hospitalizations, surgeries, and ER visits in previous 12 months J.  Notus such as hearing and vision if needed, cognitive and depression L. Referrals, records requested, and appointments- none   In addition, I have reviewed and discussed with patient certain preventive protocols, quality metrics, and best practice recommendations. A written personalized care plan for preventive services as well as general preventive health recommendations were provided to patient.   Signed,  Denman George, LPN  Nurse Health Advisor   Nurse Notes: no additional

## 2020-01-21 NOTE — Telephone Encounter (Signed)
02/14/2020 Needs to keep appt for more refills.

## 2020-01-21 NOTE — Patient Instructions (Addendum)
[name] , Thank you for taking time to come for your Medicare Wellness Visit. I appreciate your ongoing commitment to your health goals. Please review the following plan we discussed and let me know if I can assist you in the future.   Screening recommendations/referrals: Colorectal Screening: up to date; Cologuard completed 05/01/18   Vision and Dental Exams: Recommended annual ophthalmology exams for early detection of glaucoma and other disorders of the eye Recommended annual dental exams for proper oral hygiene  Vaccinations: Influenza vaccine: completed 07/23/19 Pneumococcal vaccine: Prevnar recommended  Tdap vaccine: up to date; last 07/07/18 Shingles vaccine:  You may receive this vaccine at your local pharmacy. (see handout)   Advanced directives: I have provided a copy for you to complete at home and have notarized. Once this is complete please bring a copy in to our office so we can scan it into your chart.  Goals: Recommend to drink at least 6-8 8oz glasses of water per day and consume a balanced diet rich in fresh fruits and vegetables.    Next appointment: Please schedule your Annual Wellness Visit with your Nurse Health Advisor in one year.  Preventive Care 70 Years and Older, Male, Male Preventive care refers to lifestyle choices and visits with your health care provider that can promote health and wellness. What does preventive care include?  A yearly physical exam. This is also called an annual well check.  Dental exams once or twice a year.  Routine eye exams. Ask your health care provider how often you should have your eyes checked.  Personal lifestyle choices, including:  Daily care of your teeth and gums.  Regular physical activity.  Eating a healthy diet.  Avoiding tobacco and drug use.  Limiting alcohol use.  Practicing safe sex.  Taking low doses of aspirin every day if recommended by your health care provider..  Taking vitamin and mineral  supplements as recommended by your health care provider. What happens during an annual well check? The services and screenings done by your health care provider during your annual well check will depend on your age, overall health, lifestyle risk factors, and family history of disease. Counseling  Your health care provider may ask you questions about your:  Alcohol use.  Tobacco use.  Drug use.  Emotional well-being.  Home and relationship well-being.  Sexual activity.  Eating habits.  History of falls.  Memory and ability to understand (cognition).  Work and work Statistician. Screening  You may have the following tests or measurements:  Height, weight, and BMI.  Blood pressure.  Lipid and cholesterol levels. These may be checked every 5 years, or more frequently if you are over 46 years old.  Skin check.  Lung cancer screening. You may have this screening every year starting at age 56 if you have a 30-pack-year history of smoking and currently smoke or have quit within the past 15 years.  Fecal occult blood test (FOBT) of the stool. You may have this test every year starting at age 75.  Flexible sigmoidoscopy or colonoscopy. You may have a sigmoidoscopy every 5 years or a colonoscopy every 10 years starting at age 76.  Prostate cancer screening. Recommendations will vary depending on your family history and other risks.  Hepatitis C blood test.  Hepatitis B blood test.  Sexually transmitted disease (STD) testing.  Diabetes screening. This is done by checking your blood sugar (glucose) after you have not eaten for a while (fasting). You may have this done every 1-3  years.  Abdominal aortic aneurysm (AAA) screening. You may need this if you are a current or former smoker.  Osteoporosis. You may be screened starting at age 74 if you are at high risk. Talk with your health care provider about your test results, treatment options, and if necessary, the need for more  tests. Vaccines  Your health care provider may recommend certain vaccines, such as:  Influenza vaccine. This is recommended every year.  Tetanus, diphtheria, and acellular pertussis (Tdap, Td) vaccine. You may need a Td booster every 10 years.  Zoster vaccine. You may need this after age 43.  Pneumococcal 13-valent conjugate (PCV13) vaccine. One dose is recommended after age 37.  Pneumococcal polysaccharide (PPSV23) vaccine. One dose is recommended after age 95. Talk to your health care provider about which screenings and vaccines you need and how often you need them. This information is not intended to replace advice given to you by your health care provider. Make sure you discuss any questions you have with your health care provider. Document Released: 11/21/2015 Document Revised: 07/14/2016 Document Reviewed: 08/26/2015 Elsevier Interactive Patient Education  2017 [place] Prevention in the Home Falls can cause injuries. They can happen to people of all ages. There are many things you can do to make your home safe and to help prevent falls. What can I do on the outside of my home?  Regularly fix the edges of walkways and driveways and fix any cracks.  Remove anything that might make you trip as you walk through a door, such as a raised step or threshold.  Trim any bushes or trees on the path to your home.  Use bright outdoor lighting.  Clear any walking paths of anything that might make someone trip, such as rocks or tools.  Regularly check to see if handrails are loose or broken. Make sure that both sides of any steps have handrails.  Any raised decks and porches should have guardrails on the edges.  Have any leaves, snow, or ice cleared regularly.  Use sand or salt on walking paths during winter.  Clean up any spills in your garage right away. This includes oil or grease spills. What can I do in the bathroom?  Use night lights.  Install grab bars by the  toilet and in the tub and shower. Do not use towel bars as grab bars.  Use non-skid mats or decals in the tub or shower.  If you need to sit down in the shower, use a plastic, non-slip stool.  Keep the floor dry. Clean up any water that spills on the floor as soon as it happens.  Remove soap buildup in the tub or shower regularly.  Attach bath mats securely with double-sided non-slip rug tape.  Do not have throw rugs and other things on the floor that can make you trip. What can I do in the bedroom?  Use night lights.  Make sure that you have a light by your bed that is easy to reach.  Do not use any sheets or blankets that are too big for your bed. They should not hang down onto the floor.  Have a firm chair that has side arms. You can use this for support while you get dressed.  Do not have throw rugs and other things on the floor that can make you trip. What can I do in the kitchen?  Clean up any spills right away.  Avoid walking on wet floors.  Keep items that  you use a lot in easy-to-reach places.  If you need to reach something above you, use a strong step stool that has a grab bar.  Keep electrical cords out of the way.  Do not use floor polish or wax that makes floors slippery. If you must use wax, use non-skid floor wax.  Do not have throw rugs and other things on the floor that can make you trip. What can I do with my stairs?  Do not leave any items on the stairs.  Make sure that there are handrails on both sides of the stairs and use them. Fix handrails that are broken or loose. Make sure that handrails are as long as the stairways.  Check any carpeting to make sure that it is firmly attached to the stairs. Fix any carpet that is loose or worn.  Avoid having throw rugs at the top or bottom of the stairs. If you do have throw rugs, attach them to the floor with carpet tape.  Make sure that you have a light switch at the top of the stairs and the bottom of  the stairs. If you do not have them, ask someone to add them for you. What else can I do to help prevent falls?  Wear shoes that:  Do not have high heels.  Have rubber bottoms.  Are comfortable and fit you well.  Are closed at the toe. Do not wear sandals.  If you use a stepladder:  Make sure that it is fully opened. Do not climb a closed stepladder.  Make sure that both sides of the stepladder are locked into place.  Ask someone to hold it for you, if possible.  Clearly mark and make sure that you can see:  Any grab bars or handrails.  First and last steps.  Where the edge of each step is.  Use tools that help you move around (mobility aids) if they are needed. These include:  Canes.  Walkers.  Scooters.  Crutches.  Turn on the lights when you go into a dark area. Replace any light bulbs as soon as they burn out.  Set up your furniture so you have a clear path. Avoid moving your furniture around.  If any of your floors are uneven, fix them.  If there are any pets around you, be aware of where they are.  Review your medicines with your doctor. Some medicines can make you feel dizzy. This can increase your chance of falling. Ask your doctor what other things that you can do to help prevent falls. This information is not intended to replace advice given to you by your health care provider. Make sure you discuss any questions you have with your health care provider. Document Released: 08/21/2009 Document Revised: 04/01/2016 Document Reviewed: 11/29/2014 Elsevier Interactive Patient Education  2017 [employer].

## 2020-02-14 ENCOUNTER — Ambulatory Visit: Payer: Medicare HMO | Admitting: Family Medicine

## 2020-03-05 ENCOUNTER — Encounter: Payer: Self-pay | Admitting: Family Medicine

## 2020-03-11 NOTE — Progress Notes (Signed)
Patient called in regard.  States that he is used to getting called the day previous for screening.  He also states that he was told he did not need this appt.   I reminded the patient of our policies and informed him that I would change the status of this appt to void the $50.00 service fee but that we would not be able to next time.   Patient states he wants his Mychart account deactivated because he does not use it.

## 2020-03-18 DIAGNOSIS — Z03818 Encounter for observation for suspected exposure to other biological agents ruled out: Secondary | ICD-10-CM | POA: Diagnosis not present

## 2020-03-18 DIAGNOSIS — Z20828 Contact with and (suspected) exposure to other viral communicable diseases: Secondary | ICD-10-CM | POA: Diagnosis not present

## 2020-04-22 ENCOUNTER — Other Ambulatory Visit: Payer: Self-pay | Admitting: Family Medicine

## 2020-05-03 ENCOUNTER — Other Ambulatory Visit: Payer: Self-pay | Admitting: Family Medicine

## 2020-05-05 ENCOUNTER — Other Ambulatory Visit: Payer: Self-pay | Admitting: Family Medicine

## 2021-02-02 ENCOUNTER — Ambulatory Visit: Payer: Medicare HMO

## 2021-02-05 IMAGING — DX LEFT KNEE 3 VIEWS
3 series · 3 of 3 positions shown · non-contrast
Comparison: None.

CLINICAL DATA: Acute onset of left knee pain.

EXAM:
LEFT KNEE 3 VIEWS

[knee standing ap]
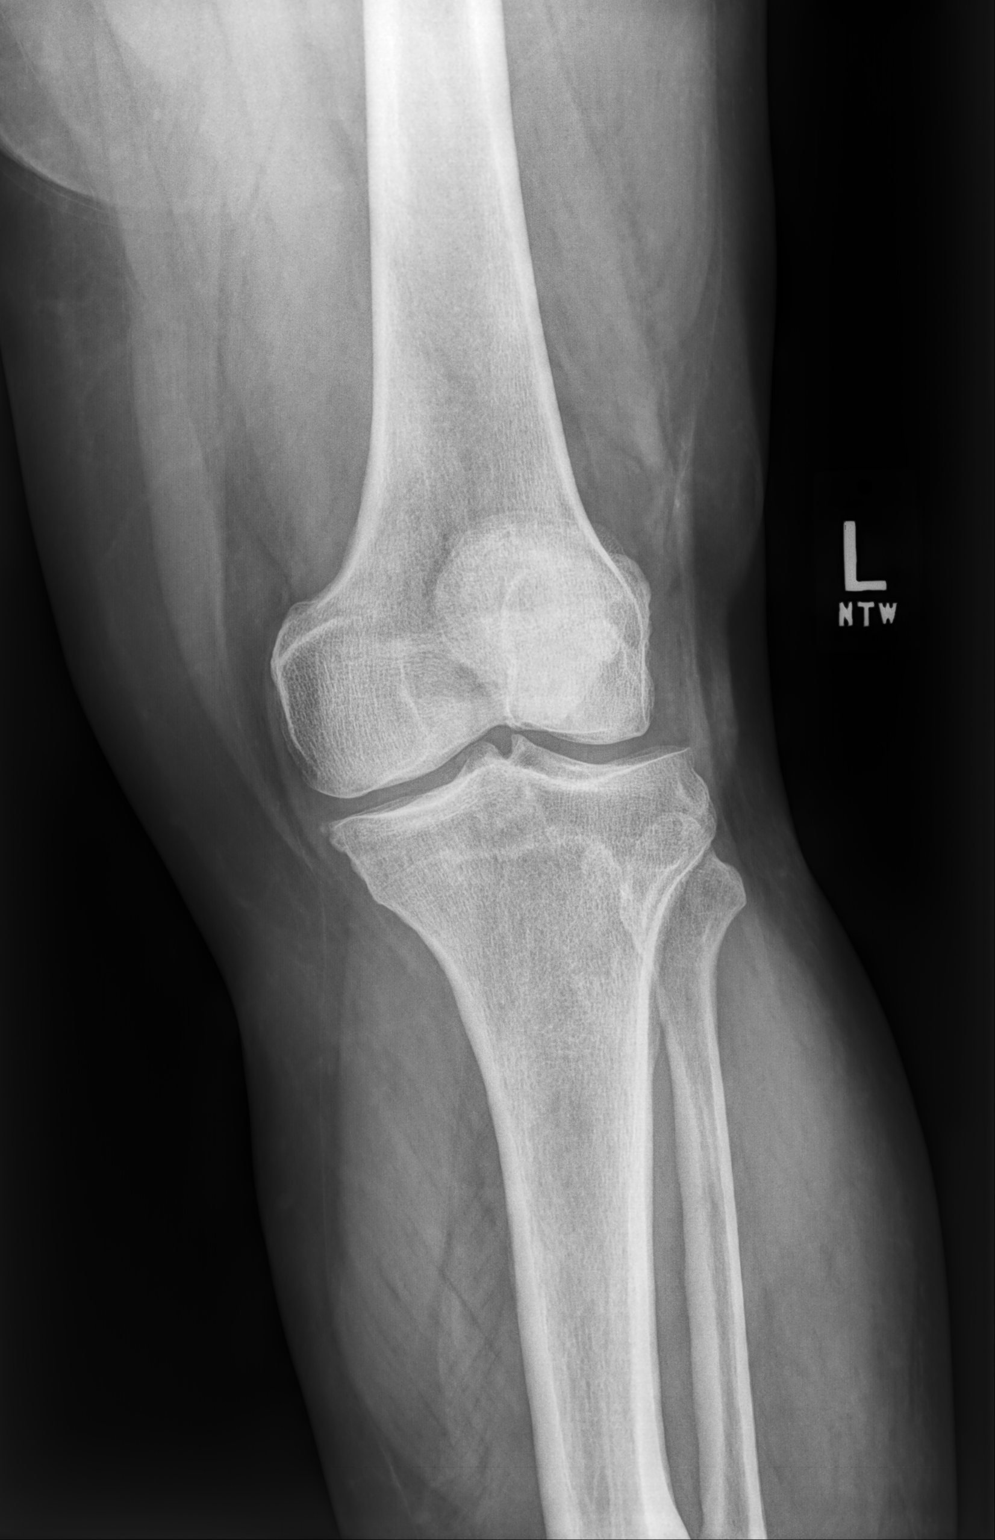

[knee standing lat]
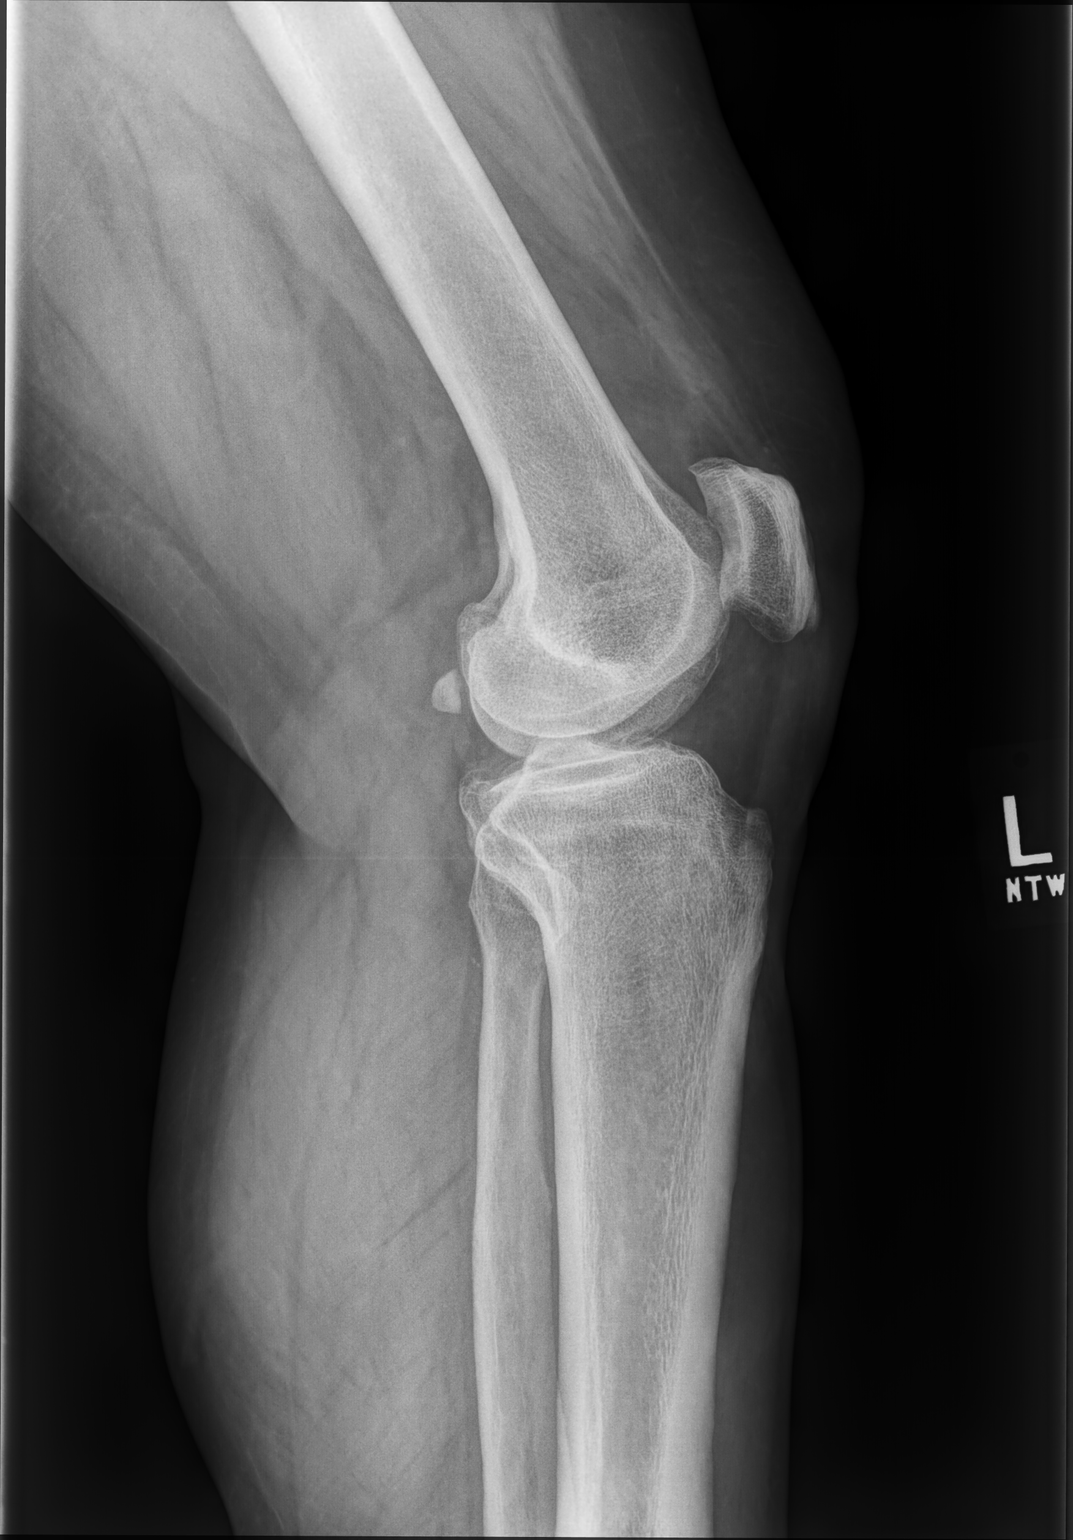

[sunrise]
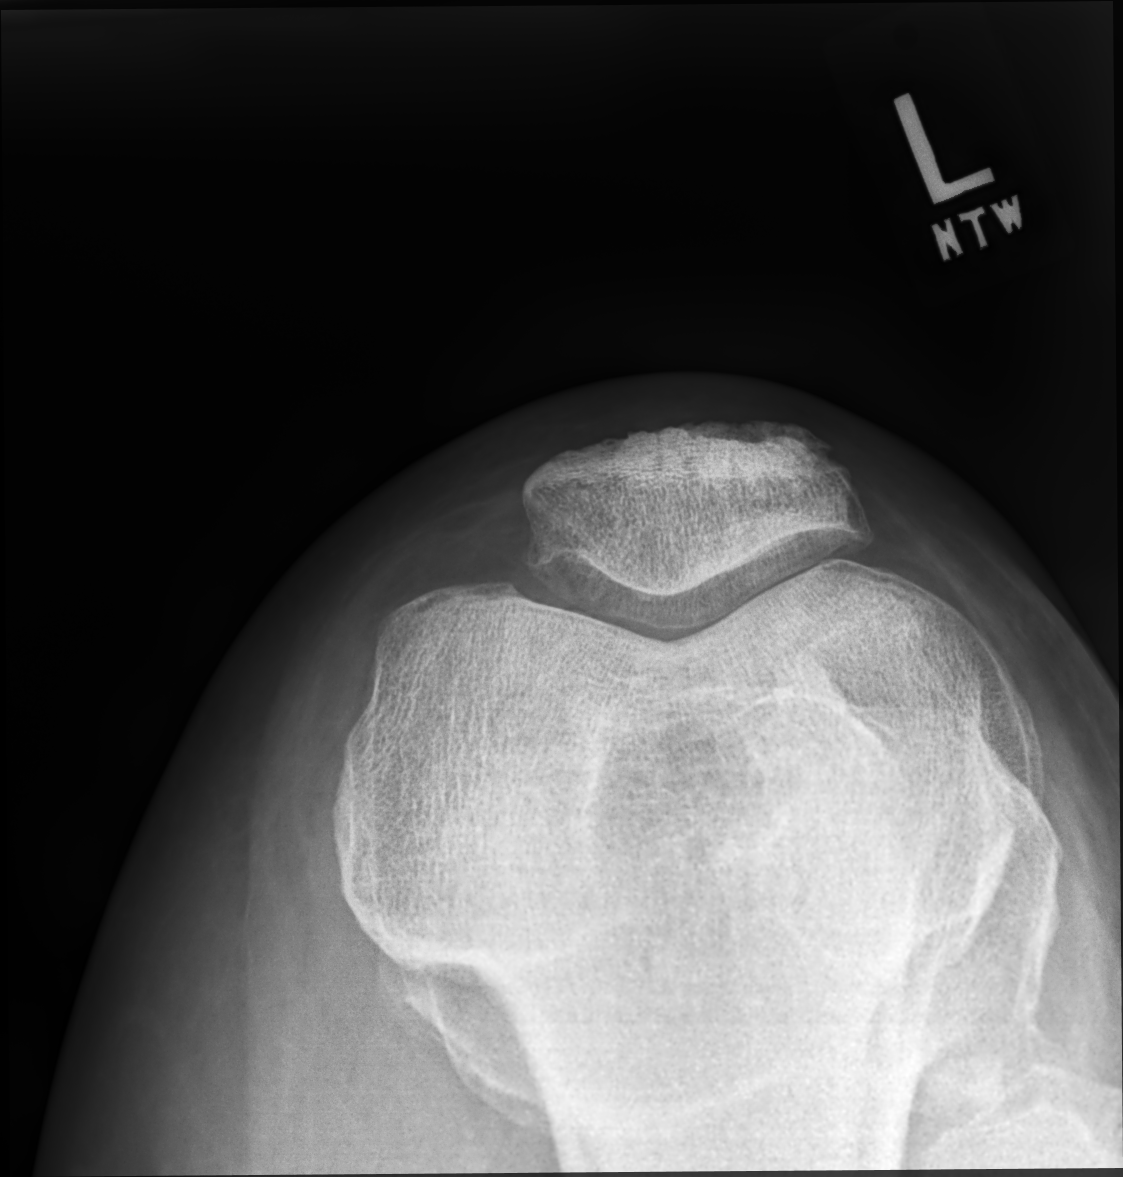

[3 of 3 positions shown; findings below may reference images not displayed]

FINDINGS: There is no evidence of fracture or dislocation. The joint spaces
are preserved. Minimal degenerative change is noted at the medial
aspect of the knee. A fabella is noted.

No significant joint effusion is seen. The visualized soft tissues
are normal in appearance.
IMPRESSION: 1. No evidence of fracture or dislocation.
2. Minimal degenerative change at the medial aspect of the knee.

## 2021-03-09 ENCOUNTER — Other Ambulatory Visit: Payer: Self-pay

## 2021-03-09 ENCOUNTER — Ambulatory Visit (INDEPENDENT_AMBULATORY_CARE_PROVIDER_SITE_OTHER): Payer: Medicare HMO

## 2021-03-09 ENCOUNTER — Ambulatory Visit (INDEPENDENT_AMBULATORY_CARE_PROVIDER_SITE_OTHER): Payer: Medicare HMO | Admitting: Family Medicine

## 2021-03-09 ENCOUNTER — Encounter: Payer: Self-pay | Admitting: Family Medicine

## 2021-03-09 VITALS — BP 136/84 | HR 67 | Temp 97.7°F | Resp 16 | Ht 63.0 in | Wt 265.4 lb

## 2021-03-09 DIAGNOSIS — G4733 Obstructive sleep apnea (adult) (pediatric): Secondary | ICD-10-CM | POA: Diagnosis not present

## 2021-03-09 DIAGNOSIS — R7303 Prediabetes: Secondary | ICD-10-CM

## 2021-03-09 DIAGNOSIS — I5030 Unspecified diastolic (congestive) heart failure: Secondary | ICD-10-CM | POA: Diagnosis not present

## 2021-03-09 DIAGNOSIS — Z7185 Encounter for immunization safety counseling: Secondary | ICD-10-CM | POA: Diagnosis not present

## 2021-03-09 DIAGNOSIS — N528 Other male erectile dysfunction: Secondary | ICD-10-CM | POA: Diagnosis not present

## 2021-03-09 DIAGNOSIS — Z23 Encounter for immunization: Secondary | ICD-10-CM

## 2021-03-09 DIAGNOSIS — E782 Mixed hyperlipidemia: Secondary | ICD-10-CM | POA: Diagnosis not present

## 2021-03-09 DIAGNOSIS — R0600 Dyspnea, unspecified: Secondary | ICD-10-CM | POA: Diagnosis not present

## 2021-03-09 HISTORY — DX: Unspecified diastolic (congestive) heart failure: I50.30

## 2021-03-09 LAB — CBC WITH DIFFERENTIAL/PLATELET
Basophils Absolute: 0.1 10*3/uL (ref 0.0–0.1)
Basophils Relative: 0.8 % (ref 0.0–3.0)
Eosinophils Absolute: 0.1 10*3/uL (ref 0.0–0.7)
Eosinophils Relative: 1.5 % (ref 0.0–5.0)
HCT: 45.1 % (ref 39.0–52.0)
Hemoglobin: 14.9 g/dL (ref 13.0–17.0)
Lymphocytes Relative: 23 % (ref 12.0–46.0)
Lymphs Abs: 1.6 10*3/uL (ref 0.7–4.0)
MCHC: 33 g/dL (ref 30.0–36.0)
MCV: 89.5 fl (ref 78.0–100.0)
Monocytes Absolute: 0.7 10*3/uL (ref 0.1–1.0)
Monocytes Relative: 9.5 % (ref 3.0–12.0)
Neutro Abs: 4.5 10*3/uL (ref 1.4–7.7)
Neutrophils Relative %: 65.2 % (ref 43.0–77.0)
Platelets: 177 10*3/uL (ref 150.0–400.0)
RBC: 5.03 Mil/uL (ref 4.22–5.81)
RDW: 12.9 % (ref 11.5–15.5)
WBC: 6.8 10*3/uL (ref 4.0–10.5)

## 2021-03-09 LAB — COMPREHENSIVE METABOLIC PANEL
ALT: 42 U/L (ref 0–53)
AST: 27 U/L (ref 0–37)
Albumin: 4.1 g/dL (ref 3.5–5.2)
Alkaline Phosphatase: 68 U/L (ref 39–117)
BUN: 22 mg/dL (ref 6–23)
CO2: 25 mEq/L (ref 19–32)
Calcium: 9.7 mg/dL (ref 8.4–10.5)
Chloride: 105 mEq/L (ref 96–112)
Creatinine, Ser: 0.94 mg/dL (ref 0.40–1.50)
GFR: 81.91 mL/min (ref >60.00)
Glucose, Bld: 80 mg/dL (ref 70–99)
Potassium: 4.1 mEq/L (ref 3.5–5.1)
Sodium: 139 mEq/L (ref 135–145)
Total Bilirubin: 1.2 mg/dL (ref 0.2–1.2)
Total Protein: 6.8 g/dL (ref 6.0–8.3)

## 2021-03-09 LAB — MICROALBUMIN / CREATININE URINE RATIO
Creatinine,U: 133.2 mg/dL
Microalb Creat Ratio: 0.6 mg/g (ref 0.0–30.0)
Microalb, Ur: 0.8 mg/dL (ref 0.0–1.9)

## 2021-03-09 LAB — TSH: TSH: 1.73 u[IU]/mL (ref 0.35–4.50)

## 2021-03-09 LAB — LIPID PANEL
Cholesterol: 211 mg/dL — ABNORMAL HIGH (ref 0–200)
HDL: 30.8 mg/dL — ABNORMAL LOW (ref >39.00)
LDL Cholesterol: 152 mg/dL — ABNORMAL HIGH (ref 0–99)
NonHDL: 179.85
Total CHOL/HDL Ratio: 7
Triglycerides: 138 mg/dL (ref 0.0–149.0)
VLDL: 27.6 mg/dL (ref 0.0–40.0)

## 2021-03-09 LAB — POCT GLYCOSYLATED HEMOGLOBIN (HGB A1C): Hemoglobin A1C: 6 % — AB (ref 4.0–5.6)

## 2021-03-09 MED ORDER — POTASSIUM CHLORIDE CRYS ER 20 MEQ PO TBCR: 20.0000 meq | EXTENDED_RELEASE_TABLET | Freq: Every day | ORAL | 1 refills | Status: DC

## 2021-03-09 MED ORDER — ATORVASTATIN CALCIUM 10 MG PO TABS: ORAL_TABLET | ORAL | 1 refills | Status: DC

## 2021-03-09 MED ORDER — SILDENAFIL CITRATE 100 MG PO TABS: 50.0000 mg | ORAL_TABLET | ORAL | 3 refills | Status: DC | PRN

## 2021-03-09 MED ORDER — FUROSEMIDE 20 MG PO TABS: 20.0000 mg | ORAL_TABLET | Freq: Every day | ORAL | 3 refills | Status: DC

## 2021-03-09 NOTE — Progress Notes (Signed)
Subjective  CC:  Chief Complaint  Patient presents with  . Shortness of Breath    Has been ongoing for about 6 months, worse at night makes it hard to falls.   . Leg Swelling    Both legs, on going for years.    HPI: Roy Harris is a 70 y.o. male who presents to the office today to address the problems listed above in the chief complaint.  70 year old male with history of hypertension, diastolic CHF with chronic lower extremity edema formally treated with Lasix, last echo 2020, morbid obesity, sleep apnea and prediabetes presents for follow-up.  He has been more than 18 months since his last visit.  He complains of the recurrence of his symptoms of shortness of breath, sleep apnea symptoms at night, lower extremity edema, nocturia, polyuria and erectile dysfunction.  He stopped all his medications because he ran out.  He reports that the Lasix was helping his lower extremity edema.  He denies new symptoms specifically no chest pain, palpitations.  He has never been a smoker.  He has seen pulmonology 2 years ago and had a positive home sleep test for sleep apnea but never got set up for CPAP.  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  Eats on the go a lot.  Has not been able to lose weight.  Hyperlipidemia: He stopped his statin.  He was tolerating it well.  Just ran out.  Health maintenance: Overdue for complete physical, eligible for pneumonia vaccinations and Shingrix.  Colorectal screening up-to-date.  Erectile dysfunction has been well controlled and treated with Viagra.  He requests refill.  No chest pain.  No use of nitroglycerin.   Assessment  1. Diastolic CHF with preserved left ventricular function, NYHA class 2 ([place])   2. Prediabetes   3. Obstructive sleep apnea   4. Morbid obesity ([place])   5. Other male erectile dysfunction   6. Mixed hyperlipidemia   7. Vaccine counseling   8. Immunization due      Plan   Diastolic right-sided  heart failure: Restart Lasix and potassium.  Recommend low-sodium diet.  Recheck electrolytes and renal function.  No symptoms of ischemic heart disease at this time.  Check chest x-ray to ensure no pulmonary edema given his complaint of shortness of breath  Shortness of breath likely multifactorial including heart failure, morbid obesity and sleep apnea.  Refer back to pulmonology to get him set up for CPAP.  May warrant another sleep test.  Morbid obesity: Recommend low-fat diabetic diet for weight loss.  See after visit summary.  Mixed hyperlipidemia formally on a statin.  Recheck lipids and restart statin.  Discussed risk of cardiovascular disease including hyperlipidemia, obesity and heart failure and sleep apnea.  Recommend regular follow-up.  Erectile dysfunction: Successfully treated with Viagra.  Refill today.  Updated Prevnar today.  Defer Shingrix given he will be traveling tomorrow.  We will update that at next visit.  Today's visit was 41 minutes long. Greater than 50% of this time was devoted to face to face counseling with the patient and coordination of care. We discussed his diagnosis, prognosis, treatment options and treatment plan is documented above.   Follow up: 3 months for recheck blood pressure and cholesterol and sugars Visit date not found  Orders Placed This Encounter  Procedures  . DG Chest 2 View  . Pneumococcal conjugate vaccine 13-valent  . CBC with Differential/Platelet  . Comprehensive metabolic panel  .  Lipid panel  . TSH  . Microalbumin / creatinine urine ratio  . Ambulatory referral to Pulmonology  . POCT glycosylated hemoglobin (Hb A1C)   Meds ordered this encounter  Medications  . atorvastatin (LIPITOR) 10 MG tablet    Sig: Take 1 tablet (10mg ) nightly at bedtime    Dispense:  90 tablet    Refill:  1  . furosemide (LASIX) 20 MG tablet    Sig: Take 1 tablet (20 mg total) by mouth daily. Take with potassium    Dispense:  90 tablet    Refill:   3  . potassium chloride SA (KLOR-CON) 20 MEQ tablet    Sig: Take 1 tablet (20 mEq total) by mouth daily. Take with lasix    Dispense:  90 tablet    Refill:  1  . sildenafil (VIAGRA) 100 MG tablet    Sig: Take 0.5-1 tablets (50-100 mg total) by mouth as needed for erectile dysfunction.    Dispense:  10 tablet    Refill:  3      I reviewed the patients updated PMH, FH, and SocHx.    Patient Active Problem List   Diagnosis Date Noted  . Obstructive sleep apnea 03/09/2021  . Diastolic CHF with preserved left ventricular function, NYHA class 2 ([place]) 03/09/2021  . Tricompartment osteoarthritis of left knee 07/25/2019  . Complex tear of medial meniscus of left knee as current injury 07/25/2019  . Prediabetes 07/24/2019  . Other male erectile dysfunction 06/29/2019  . Mixed hyperlipidemia 07/11/2018  . Lentigo 04/06/2018  . Morbid obesity ([place]) 04/06/2018   Current Meds  Medication Sig  . acetaminophen-codeine (TYLENOL #3) 300-30 MG tablet Take 1 tablet by mouth every 6 (six) hours as needed for moderate pain or severe pain.    Allergies: Patient has No Known Allergies. Family History: Patient family history includes Alcohol abuse in his father; COPD in his mother; Clotting disorder in his sister; Diabetes in his daughter; Healthy in his son; Kidney disease in his brother; Stroke in his father. Social History:  Patient  reports that he has never smoked. He has never used smokeless tobacco. He reports that he does not drink alcohol and does not use drugs.  Review of Systems: Constitutional: Negative for fever malaise or anorexia Cardiovascular: negative for chest pain Respiratory: + for SOB or persistent cough Gastrointestinal: negative for abdominal pain   Objective  Vitals: BP 136/84   Pulse 67   Temp 97.7 F (36.5 C) (Temporal)   Resp 16   Ht 5\' 3"  (1.6 m)   Wt 265 lb 6.4 oz (120.4 kg)   SpO2 98%   BMI 47.01 kg/m  General: no acute distress , A&Ox3 HEENT: PEERL,  conjunctiva normal, neck is supple Cardiovascular:  RRR without murmur or gallop.  +2 pitting edema bilateral lower extremities Respiratory:  Good breath sounds bilaterally, CTAB with normal respiratory effort Gastrointestinal: soft, flat abdomen, normal active bowel sounds, no palpable masses, no hepatosplenomegaly, no appreciated hernias Skin:  Warm, no rashes  Lab Results  Component Value Date   HGBA1C 6.0 (A) 03/09/2021   Lab Results  Component Value Date   WBC 5.9 07/23/2019   HGB 14.7 07/23/2019   HCT 44.8 07/23/2019   MCV 91.4 07/23/2019   PLT 185.0 07/23/2019   Lab Results  Component Value Date   CHOL 158 07/23/2019   CHOL 192 05/31/2019   CHOL 216 (H) 07/07/2018   Lab Results  Component Value Date   HDL 34.20 (L) 07/23/2019  HDL 32.00 (L) 05/31/2019   HDL 31.60 (L) 07/07/2018   Lab Results  Component Value Date   LDLCALC 98 07/23/2019   LDLCALC 160 (H) 07/07/2018   Lab Results  Component Value Date   TRIG 130.0 07/23/2019   TRIG 267.0 (H) 05/31/2019   TRIG 123.0 07/07/2018   Lab Results  Component Value Date   CHOLHDL 5 07/23/2019   CHOLHDL 6 05/31/2019   CHOLHDL 7 07/07/2018   Lab Results  Component Value Date   LDLDIRECT 131.0 05/31/2019   Lab Results  Component Value Date   CREATININE 0.93 07/23/2019   BUN 20 07/23/2019   NA 140 07/23/2019   K 4.2 07/23/2019   CL 106 07/23/2019   CO2 25 07/23/2019      Commons side effects, risks, benefits, and alternatives for medications and treatment plan prescribed today were discussed, and the patient expressed understanding of the given instructions. Patient is instructed to call or message via MyChart if he/she has any questions or concerns regarding our treatment plan. No barriers to understanding were identified. We discussed Red Flag symptoms and signs in detail. Patient expressed understanding regarding what to do in case of urgent or emergency type symptoms.   Medication list was reconciled,  printed and provided to the patient in AVS. Patient instructions and summary information was reviewed with the patient as documented in the AVS. This note was prepared with assistance of Dragon voice recognition software. Occasional wrong-word or sound-a-like substitutions may have occurred due to the inherent limitations of voice recognition software  This visit occurred during the SARS-CoV-2 public health emergency.  Safety protocols were in place, including screening questions prior to the visit, additional usage of staff PPE, and extensive cleaning of exam room while observing appropriate contact time as indicated for disinfecting solutions.

## 2021-03-09 NOTE — Patient Instructions (Addendum)
Please return in 3 months to recheck blood pressure, cholesterol and breathing.   Please restart your medications as ordered.  I have set up a referral to go back to the lung doctor to assess the need for the cpap machine to assist you at night with your breathing.   Please eat a low sugar diet, low fat diet and work on weight loss. See below for help.   If you have any questions or concerns, please don't hesitate to send me a message via MyChart or call the office at [phone]. Thank you for visiting with Roy Harris today! It's our pleasure caring for you.   Sleep Apnea Sleep apnea is a condition in which breathing pauses or becomes shallow during sleep. Episodes of sleep apnea usually last 10 seconds or longer, and they may occur as many as 20 times an hour. Sleep apnea disrupts your sleep and keeps your body from getting the rest that it needs. This condition can increase your risk of certain health problems, including:  Heart attack.  Stroke.  Obesity.  Diabetes.  Heart failure.  Irregular heartbeat. What are the causes? There are three kinds of sleep apnea:  Obstructive sleep apnea. This kind is caused by a blocked or collapsed airway.  Central sleep apnea. This kind happens when the part of the brain that controls breathing does not send the correct signals to the muscles that control breathing.  Mixed sleep apnea. This is a combination of obstructive and central sleep apnea. The most common cause of this condition is a collapsed or blocked airway. An airway can collapse or become blocked if:  Your throat muscles are abnormally relaxed.  Your tongue and tonsils are larger than normal.  You are overweight.  Your airway is smaller than normal.   What increases the risk? You are more likely to develop this condition if you:  Are overweight.  Smoke.  Have a smaller than normal airway.  Are elderly.  Are male.  Drink alcohol.  Take sedatives or  tranquilizers.  Have a family history of sleep apnea. What are the signs or symptoms? Symptoms of this condition include:  Trouble staying asleep.  Daytime sleepiness and tiredness.  Irritability.  Loud snoring.  Morning headaches.  Trouble concentrating.  Forgetfulness.  Decreased interest in sex.  Unexplained sleepiness.  Mood swings.  Personality changes.  Feelings of depression.  Waking up often during the night to urinate.  Dry mouth.  Sore throat. How is this diagnosed? This condition may be diagnosed with:  A medical history.  A physical exam.  A series of tests that are done while you are sleeping (sleep study). These tests are usually done in a sleep lab, but they may also be done at home. How is this treated? Treatment for this condition aims to restore normal breathing and to ease symptoms during sleep. It may involve managing health issues that can affect breathing, such as high blood pressure or obesity. Treatment may include:  Sleeping on your side.  Using a decongestant if you have nasal congestion.  Avoiding the use of depressants, including alcohol, sedatives, and narcotics.  Losing weight if you are overweight.  Making changes to your diet.  Quitting smoking.  Using a device to open your airway while you sleep, such as: ? An oral appliance. This is a custom-made mouthpiece that shifts your lower jaw forward. ? A continuous positive airway pressure (CPAP) device. This device blows air through a mask when you breathe out (exhale). ? A  nasal expiratory positive airway pressure (EPAP) device. This device has valves that you put into each nostril. ? A bi-level positive airway pressure (BPAP) device. This device blows air through a mask when you breathe in (inhale) and breathe out (exhale).  Having surgery if other treatments do not work. During surgery, excess tissue is removed to create a wider airway. It is important to get treatment for  sleep apnea. Without treatment, this condition can lead to:  High blood pressure.  Coronary artery disease.  In men, an inability to achieve or maintain an erection (impotence).  Reduced thinking abilities.   Follow these instructions at home: Lifestyle  Make any lifestyle changes that your health care provider recommends.  Eat a healthy, well-balanced diet.  Take steps to lose weight if you are overweight.  Avoid using depressants, including alcohol, sedatives, and narcotics.  Do not use any products that contain nicotine or tobacco, such as cigarettes, e-cigarettes, and chewing tobacco. If you need help quitting, ask your health care provider. General instructions  Take over-the-counter and prescription medicines only as told by your health care provider.  If you were given a device to open your airway while you sleep, use it only as told by your health care provider.  If you are having surgery, make sure to tell your health care provider you have sleep apnea. You may need to bring your device with you.  Keep all follow-up visits as told by your health care provider. This is important. Contact a health care provider if:  The device that you received to open your airway during sleep is uncomfortable or does not seem to be working.  Your symptoms do not improve.  Your symptoms get worse. Get help right away if:  You develop: ? Chest pain. ? Shortness of breath. ? Discomfort in your back, arms, or stomach.  You have: ? Trouble speaking. ? Weakness on one side of your body. ? Drooping in your face. These symptoms may represent a serious problem that is an emergency. Do not wait to see if the symptoms will go away. Get medical help right away. Call your local emergency services (911 in the [place] Do not drive yourself to the hospital. Summary  Sleep apnea is a condition in which breathing pauses or becomes shallow during sleep.  The most common cause is a collapsed  or blocked airway.  The goal of treatment is to restore normal breathing and to ease symptoms during sleep. This information is not intended to replace advice given to you by your health care provider. Make sure you discuss any questions you have with your health care provider. Document Revised: 04/11/2019 Document Reviewed: 06/20/2018 Elsevier Patient Education  2021 [employer] and Cholesterol Restricted Eating Plan Getting too much fat and cholesterol in your diet may cause health problems. Choosing the right foods helps keep your fat and cholesterol at normal levels. This can keep you from getting certain diseases. Your doctor may recommend an eating plan that includes:  Total fat: ______% or less of total calories a day.  Saturated fat: ______% or less of total calories a day.  Cholesterol: less than _________mg a day.  Fiber: ______g a day. What are tips for following this plan? Meal planning  At meals, divide your plate into four equal parts: ? Fill one-half of your plate with vegetables and green salads. ? Fill one-fourth of your plate with whole grains. ? Fill one-fourth of your plate with low-fat (lean) protein foods.  Eat fish that is high in omega-3 fats at least two times a week. This includes mackerel, tuna, sardines, and salmon.  Eat foods that are high in fiber, such as whole grains, beans, apples, broccoli, carrots, peas, and barley. General tips  Work with your doctor to lose weight if you need to.  Avoid: ? Foods with added sugar. ? Fried foods. ? Foods with partially hydrogenated oils.  Limit alcohol intake to no more than 1 drink a day for nonpregnant women and 2 drinks a day for men. One drink equals 12 oz of beer, 5 oz of wine, or 1 oz of hard liquor.   Reading food labels  Check food labels for: ? Trans fats. ? Partially hydrogenated oils. ? Saturated fat (g) in each serving. ? Cholesterol (mg) in each serving. ? Fiber (g) in each  serving.  Choose foods with healthy fats, such as: ? Monounsaturated fats. ? Polyunsaturated fats. ? Omega-3 fats.  Choose grain products that have whole grains. Look for the word "whole" as the first word in the ingredient list. Cooking  Cook foods using low-fat methods. These include baking, boiling, grilling, and broiling.  Eat more home-cooked foods. Eat at restaurants and buffets less often.  Avoid cooking using saturated fats, such as butter, cream, palm oil, palm kernel oil, and coconut oil. Recommended foods Fruits  All fresh, canned (in natural juice), or frozen fruits. Vegetables  Fresh or frozen vegetables (raw, steamed, roasted, or grilled). Green salads. Grains  Whole grains, such as whole wheat or whole grain breads, crackers, cereals, and pasta. Unsweetened oatmeal, bulgur, barley, quinoa, or brown rice. Corn or whole wheat flour tortillas. Meats and other protein foods  Ground beef (85% or leaner), grass-fed beef, or beef trimmed of fat. Skinless chicken or [place]. Ground chicken or [place]. Pork trimmed of fat. All fish and seafood. Egg whites. Dried beans, peas, or lentils. Unsalted nuts or seeds. Unsalted canned beans. Nut butters without added sugar or oil. Dairy  Low-fat or nonfat dairy products, such as skim or 1% milk, 2% or reduced-fat cheeses, low-fat and fat-free ricotta or cottage cheese, or plain low-fat and nonfat yogurt. Fats and oils  Tub margarine without trans fats. Light or reduced-fat mayonnaise and salad dressings. Avocado. Olive, canola, sesame, or safflower oils. The items listed above may not be a complete list of foods and beverages you can eat. Contact a dietitian for more information.   Foods to avoid Fruits  Canned fruit in heavy syrup. Fruit in cream or butter sauce. Fried fruit. Vegetables  Vegetables cooked in cheese, cream, or butter sauce. Fried vegetables. Grains  White bread. White pasta. White rice. Cornbread. Bagels,  pastries, and croissants. Crackers and snack foods that contain trans fat and hydrogenated oils. Meats and other protein foods  Fatty cuts of meat. Ribs, chicken wings, bacon, sausage, bologna, salami, chitterlings, fatback, hot dogs, bratwurst, and packaged lunch meats. Liver and organ meats. Whole eggs and egg yolks. Chicken and [place] with skin. Fried meat. Dairy  Whole or 2% milk, cream, half-and-half, and cream cheese. Whole milk cheeses. Whole-fat or sweetened yogurt. Full-fat cheeses. Nondairy creamers and whipped toppings. Processed cheese, cheese spreads, and cheese curds. Beverages  Alcohol. Sugar-sweetened drinks such as sodas, lemonade, and fruit drinks. Fats and oils  Butter, stick margarine, lard, shortening, ghee, or bacon fat. Coconut, palm kernel, and palm oils. Sweets and desserts  Corn syrup, sugars, honey, and molasses. Candy. Jam and jelly. Syrup. Sweetened cereals. Cookies, pies, cakes, donuts, muffins, and  ice cream. The items listed above may not be a complete list of foods and beverages you should avoid. Contact a dietitian for more information. Summary  Choosing the right foods helps keep your fat and cholesterol at normal levels. This can keep you from getting certain diseases.  At meals, fill one-half of your plate with vegetables and green salads.  Eat high-fiber foods, like whole grains, beans, apples, carrots, peas, and barley.  Limit added sugar, saturated fats, alcohol, and fried foods. This information is not intended to replace advice given to you by your health care provider. Make sure you discuss any questions you have with your health care provider. Document Revised: 02/27/2020 Document Reviewed: 02/27/2020 Elsevier Patient Education  2021 [employer].

## 2021-03-10 NOTE — Progress Notes (Signed)
Please call patient: I have reviewed his/her lab results. Lab results look good. Please take the medications that were ordered yesterday and follow up as directed in 3 months for recheck.  By that time, hopefully will have seen pulmonology for sleep apnea. thanks

## 2021-04-20 ENCOUNTER — Telehealth: Payer: Self-pay | Admitting: Family Medicine

## 2021-04-20 NOTE — Progress Notes (Signed)
  Chronic Care Management   Outreach Note  04/20/2021 Name: Roy Harris MRN: [id number] DOB: 05/12/1951  Referred by: [name], MD Reason for referral : No chief complaint on file.   An unsuccessful telephone outreach was attempted today. The patient was referred to the pharmacist for assistance with care management and care coordination.   Follow Up Plan:   Lauretta Grill [employer] Scheduler

## 2021-05-08 ENCOUNTER — Encounter: Payer: Self-pay | Admitting: Pulmonary Disease

## 2021-05-08 ENCOUNTER — Ambulatory Visit: Payer: Medicare HMO | Admitting: Pulmonary Disease

## 2021-05-08 ENCOUNTER — Other Ambulatory Visit: Payer: Self-pay

## 2021-05-08 VITALS — BP 124/84 | HR 60 | Temp 97.8°F | Ht 66.0 in | Wt 267.4 lb

## 2021-05-08 DIAGNOSIS — G4733 Obstructive sleep apnea (adult) (pediatric): Secondary | ICD-10-CM | POA: Diagnosis not present

## 2021-05-08 NOTE — Patient Instructions (Signed)
Schedule for home sleep study  Will update you with results as soon as reviewed  Weight loss efforts as able  I will see you in about 3 to 4 months  Call with significant concerns  Sleep Apnea Sleep apnea affects breathing during sleep. It causes breathing to stop for 10 seconds or more, or to become shallow. People with sleep apnea usually snoreloudly. It can also increase the risk of: Heart attack. Stroke. Being very overweight (obese). Diabetes. Heart failure. Irregular heartbeat. High blood pressure. The goal of treatment is to help you breathe normally again. What are the causes?  The most common cause of this condition is a collapsed or blocked airway. There are three kinds of sleep apnea: Obstructive sleep apnea. This is caused by a blocked or collapsed airway. Central sleep apnea. This happens when the brain does not send the right signals to the muscles that control breathing. Mixed sleep apnea. This is a combination of obstructive and central sleep apnea. What increases the risk? Being overweight. Smoking. Having a small airway. Being older. Being male. Drinking alcohol. Taking medicines to calm yourself (sedatives or tranquilizers). Having family members with the condition. Having a tongue or tonsils that are larger than normal. What are the signs or symptoms? Trouble staying asleep. Loud snoring. Headaches in the morning. Waking up gasping. Dry mouth or sore throat in the morning. Being sleepy or tired during the day. If you are sleepy or tired during the day, you may also: Not be able to focus your mind (concentrate). Forget things. Get angry a lot and have mood swings. Feel sad (depressed). Have changes in your personality. Have less interest in sex, if you are male. Be unable to have an erection, if you are male. How is this treated?  Sleeping on your side. Using a medicine to get rid of mucus in your nose (decongestant). Avoiding the use of  alcohol, medicines to help you relax, or certain pain medicines (narcotics). Losing weight, if needed. Changing your diet. Quitting smoking. Using a machine to open your airway while you sleep, such as: An oral appliance. This is a mouthpiece that shifts your lower jaw forward. A CPAP device. This device blows air through a mask when you breathe out (exhale). An EPAP device. This has valves that you put in each nostril. A BPAP device. This device blows air through a mask when you breathe in (inhale) and breathe out. Having surgery if other treatments do not work. Follow these instructions at home: Lifestyle Make changes that your doctor recommends. Eat a healthy diet. Lose weight if needed. Avoid alcohol, medicines to help you relax, and some pain medicines. Do not smoke or use any products that contain nicotine or tobacco. If you need help quitting, ask your doctor. General instructions Take over-the-counter and prescription medicines only as told by your doctor. If you were given a machine to use while you sleep, use it only as told by your doctor. If you are having surgery, make sure to tell your doctor you have sleep apnea. You may need to bring your device with you. Keep all follow-up visits. Contact a doctor if: The machine that you were given to use during sleep bothers you or does not seem to be working. You do not get better. You get worse. Get help right away if: Your chest hurts. You have trouble breathing in enough air. You have an uncomfortable feeling in your back, arms, or stomach. You have trouble talking. One side of your  body feels weak. A part of your face is hanging down. These symptoms may be an emergency. Get help right away. Call your local emergency services (911 in the [place] Do not wait to see if the symptoms will go away. Do not drive yourself to the hospital. Summary This condition affects breathing during sleep. The most common cause is a collapsed  or blocked airway. The goal of treatment is to help you breathe normally while you sleep. This information is not intended to replace advice given to you by your health care provider. Make sure you discuss any questions you have with your healthcare provider. Document Revised: 10/03/2020 Document Reviewed: 10/03/2020 Elsevier Patient Education  2022 [employer].

## 2021-05-08 NOTE — Progress Notes (Signed)
Roy Harris    [id number]    1951-07-30  Primary Care Physician:[name], [name], MD  Referring Physician: [name], [name],  [name] [address]  Chief complaint:   Patient being seen for sleep apnea  HPI:  Had a sleep study done in 2020 08/08/2019-showed mild obstructive sleep apnea with mild oxygen desaturations, recommendations were for CPAP therapy  Patient stated he never got any follow-up calls however, documentation in epic reveals multiple calls to patient with messages being left, letter sent to patient and unfortunately got lost to follow-up  He reached out to his primary care doctor will made another appointment for evaluation  Admits to snoring Admits to daytime sleepiness especially in the evening when he is sitting watching TV, states he does not feel sleepy during usual activities Usually goes to bed about 9 PM, wakes up about 5 AM Multiple awakenings during the night to use the bathroom Memory is fine  About 10 pound weight gain since the last visit  Occasional shortness of breath on exertion Son as obstructive sleep apnea Never smoker  Retired Airline pilot   Outpatient Encounter Medications as of 05/08/2021  Medication Sig   acetaminophen-codeine (TYLENOL #3) 300-30 MG tablet Take 1 tablet by mouth every 6 (six) hours as needed for moderate pain or severe pain.   aspirin EC 81 MG tablet Take 1 tablet (81 mg total) by mouth daily.   atorvastatin (LIPITOR) 10 MG tablet Take 1 tablet (10mg ) nightly at bedtime   furosemide (LASIX) 20 MG tablet Take 1 tablet (20 mg total) by mouth daily. Take with potassium   potassium chloride SA (KLOR-CON) 20 MEQ tablet Take 1 tablet (20 mEq total) by mouth daily. Take with lasix   sildenafil (VIAGRA) 100 MG tablet Take 0.5-1 tablets (50-100 mg total) by mouth as needed for erectile dysfunction.   No facility-administered encounter medications on file as of 05/08/2021.    Allergies  as of 05/08/2021   (No Known Allergies)    Past Medical History:  Diagnosis Date   Diastolic CHF with preserved left ventricular function, NYHA class 2 ([place]) 03/09/2021   Left ventricular diastolic dysfunction 0/08/9322   Bilateral LE edema; Echocardiogram 07/2019   Mixed hyperlipidemia 07/11/2018   Prediabetes 07/24/2019   A1c 6.2 9/020    History reviewed. No pertinent surgical history.  Family History  Problem Relation Age of Onset   COPD Mother    Stroke Father    Alcohol abuse Father    Clotting disorder Sister    Diabetes Daughter    Healthy Son    Kidney disease Brother    Cancer Neg Hx     Social History   Socioeconomic History   Marital status: Married    Spouse name: Not on file   Number of children: 3   Years of education: Not on file   Highest education level: Not on file  Occupational History   Occupation: [employer], Ambulance person RETIRED  Tobacco Use   Smoking status: Never   Smokeless tobacco: Never  Vaping Use   Vaping Use: Never used  Substance and Sexual Activity   Alcohol use: Never   Drug use: Never   Sexual activity: Yes  Other Topics Concern   Not on file  Social History Narrative   Originally from [place], moved to [place] in 2012; married. 3 children. Family in here in [place] as well   Social Determinants of Health  Financial Resource Strain: Not on file  Food Insecurity: Not on file  Transportation Needs: Not on file  Physical Activity: Not on file  Stress: Not on file  Social Connections: Not on file  Intimate Partner Violence: Not on file    Review of Systems  Respiratory:  Positive for apnea.   Psychiatric/Behavioral:  Positive for sleep disturbance.    Vitals:   05/08/21 1141  BP: 124/84  Pulse: 60  Temp: 97.8 F (36.6 C)  SpO2: 96%     Physical Exam Constitutional:      Appearance: He is obese.  HENT:     Head: Normocephalic.     Mouth/Throat:     Mouth: Mucous membranes are moist.  Eyes:     Pupils:  Pupils are equal, round, and reactive to light.  Cardiovascular:     Rate and Rhythm: Normal rate and regular rhythm.     Heart sounds: No murmur heard.   No friction rub.  Pulmonary:     Effort: No respiratory distress.     Breath sounds: No stridor. No wheezing or rhonchi.  Musculoskeletal:     Cervical back: No rigidity or tenderness.  Neurological:     Mental Status: He is alert.  Psychiatric:        Mood and Affect: Mood normal.   Epworth Sleepiness Scale of 6 Data Reviewed: Previous sleep study reviewed showing mild obstructive sleep apnea with mild oxygen desaturations  Assessment:  Mild obstructive sleep apnea  Daytime sleepiness  History of diastolic congestive heart failure  Nonrestorative sleep  Pathophysiology of sleep disordered breathing reviewed with the patient Treatment options reviewed with the patient  Plan/Recommendations: Schedule patient for repeat home sleep study  Weight loss efforts encouraged  Treatment options was discussed with the patient  Tentative follow-up in about 3 to 4 months  Encouraged to call with any significant concerns   Sherrilyn Rist MD [facility] 05/08/2021, 12:12 PM  CC: [name], MD

## 2021-05-21 DIAGNOSIS — Z20822 Contact with and (suspected) exposure to covid-19: Secondary | ICD-10-CM | POA: Diagnosis not present

## 2021-06-05 ENCOUNTER — Ambulatory Visit: Payer: Medicare HMO

## 2021-06-05 ENCOUNTER — Other Ambulatory Visit: Payer: Self-pay

## 2021-06-05 DIAGNOSIS — G4733 Obstructive sleep apnea (adult) (pediatric): Secondary | ICD-10-CM

## 2021-06-09 ENCOUNTER — Ambulatory Visit (INDEPENDENT_AMBULATORY_CARE_PROVIDER_SITE_OTHER): Payer: Medicare HMO | Admitting: Family Medicine

## 2021-06-09 ENCOUNTER — Encounter: Payer: Self-pay | Admitting: Family Medicine

## 2021-06-09 ENCOUNTER — Other Ambulatory Visit: Payer: Self-pay

## 2021-06-09 VITALS — BP 122/78 | HR 58 | Temp 97.2°F | Resp 18 | Ht 66.0 in | Wt 262.2 lb

## 2021-06-09 DIAGNOSIS — G4733 Obstructive sleep apnea (adult) (pediatric): Secondary | ICD-10-CM

## 2021-06-09 DIAGNOSIS — Z1212 Encounter for screening for malignant neoplasm of rectum: Secondary | ICD-10-CM

## 2021-06-09 DIAGNOSIS — E782 Mixed hyperlipidemia: Secondary | ICD-10-CM | POA: Diagnosis not present

## 2021-06-09 DIAGNOSIS — R7303 Prediabetes: Secondary | ICD-10-CM

## 2021-06-09 DIAGNOSIS — Z1211 Encounter for screening for malignant neoplasm of colon: Secondary | ICD-10-CM

## 2021-06-09 DIAGNOSIS — I5030 Unspecified diastolic (congestive) heart failure: Secondary | ICD-10-CM

## 2021-06-09 LAB — COMPREHENSIVE METABOLIC PANEL
ALT: 37 U/L (ref 0–53)
AST: 28 U/L (ref 0–37)
Albumin: 3.9 g/dL (ref 3.5–5.2)
Alkaline Phosphatase: 71 U/L (ref 39–117)
BUN: 15 mg/dL (ref 6–23)
CO2: 26 mEq/L (ref 19–32)
Calcium: 9.5 mg/dL (ref 8.4–10.5)
Chloride: 105 mEq/L (ref 96–112)
Creatinine, Ser: 0.99 mg/dL (ref 0.40–1.50)
GFR: 76.84 mL/min (ref >60.00)
Glucose, Bld: 109 mg/dL — ABNORMAL HIGH (ref 70–99)
Potassium: 4.1 mEq/L (ref 3.5–5.1)
Sodium: 138 mEq/L (ref 135–145)
Total Bilirubin: 1.6 mg/dL — ABNORMAL HIGH (ref 0.2–1.2)
Total Protein: 6.6 g/dL (ref 6.0–8.3)

## 2021-06-09 LAB — LIPID PANEL
Cholesterol: 161 mg/dL (ref 0–200)
HDL: 32.5 mg/dL — ABNORMAL LOW (ref >39.00)
LDL Cholesterol: 105 mg/dL — ABNORMAL HIGH (ref 0–99)
NonHDL: 128.19
Total CHOL/HDL Ratio: 5
Triglycerides: 116 mg/dL (ref 0.0–149.0)
VLDL: 23.2 mg/dL (ref 0.0–40.0)

## 2021-06-09 MED ORDER — FUROSEMIDE 20 MG PO TABS: 40.0000 mg | ORAL_TABLET | Freq: Every day | ORAL | 3 refills | Status: DC

## 2021-06-09 NOTE — Progress Notes (Signed)
Subjective  CC:  Chief Complaint  Patient presents with   Hypertension   Hyperlipidemia    3 month recheck     HPI: Roy Harris is a 70 y.o. male who presents to the office today for follow up of diabetes and problems listed above in the chief complaint.  Hyperlipidemia follow-up: We restarted statin.  He is tolerating this well.  Due for recheck today.  No adverse effects.  Weight is down about 5 pounds.  He reports he is eating better Diastolic heart failure with bilateral lower extremity swelling: Started Lasix with potassium supplement daily.  20 mg daily.  Improved but still present.  No chest pain, shortness of breath, orthopnea. Prediabetes: Trying to eat better.  Weight is down Sleep apnea: Just completed his sleep study.  Is awaiting results and follow-up with pulmonology.  Will likely need to restart his CPAP machine. Health maintenance: Due for colorectal cancer screening.  Had negative Cologuard testing 3 years ago  Wt Readings from Last 3 Encounters:  06/09/21 262 lb 3.2 oz (118.9 kg)  05/08/21 267 lb 6.4 oz (121.3 kg)  03/09/21 265 lb 6.4 oz (120.4 kg)    BP Readings from Last 3 Encounters:  06/09/21 122/78  05/08/21 124/84  03/09/21 136/84    Assessment  1. Mixed hyperlipidemia   2. Obstructive sleep apnea   3. Diastolic CHF with preserved left ventricular function, NYHA class 2 ([place])   4. Prediabetes   5. Screening for colorectal cancer      Plan  Hyperlipidemia: Recheck lipids and LFTs on Lipitor 10 mg nightly.  Adjust dose up if indicated.  Tolerating well Diastolic heart failure with persistent lower extremity edema: Increase Lasix to 40 mg daily.  Continue potassium 20 mEq daily.  Recheck renal function and electrolytes Prediabetes: Continue diabetic diet and weight loss Await pulmonology eval for sleep apnea. Cologuard testing ordered.  Follow up: Complete physical in 3 months. Orders Placed This Encounter  Procedures   Comprehensive  metabolic panel   Lipid panel   Cologuard   Meds ordered this encounter  Medications   furosemide (LASIX) 20 MG tablet    Sig: Take 2 tablets (40 mg total) by mouth daily. Take with potassium    Dispense:  90 tablet    Refill:  3      Immunization History  Administered Date(s) Administered   Fluad Quad(high Dose 65+) 07/23/2019   Influenza, High Dose Seasonal PF 07/07/2018   PFIZER(Purple Top)SARS-COV-2 Vaccination 12/22/2019, 01/15/2020   Pneumococcal Conjugate-13 03/09/2021   Tdap 07/07/2018    Diabetes Related Lab Review: Lab Results  Component Value Date   HGBA1C 6.0 (A) 03/09/2021   HGBA1C 6.2 07/24/2019    Lab Results  Component Value Date   MICROALBUR 0.8 03/09/2021   Lab Results  Component Value Date   CREATININE 0.94 03/09/2021   BUN 22 03/09/2021   NA 139 03/09/2021   K 4.1 03/09/2021   CL 105 03/09/2021   CO2 25 03/09/2021   Lab Results  Component Value Date   CHOL 211 (H) 03/09/2021   CHOL 158 07/23/2019   CHOL 192 05/31/2019   Lab Results  Component Value Date   HDL 30.80 (L) 03/09/2021   HDL 34.20 (L) 07/23/2019   HDL 32.00 (L) 05/31/2019   Lab Results  Component Value Date   LDLCALC 152 (H) 03/09/2021   LDLCALC 98 07/23/2019   LDLCALC 160 (H) 07/07/2018   Lab Results  Component Value Date   TRIG 138.0  03/09/2021   TRIG 130.0 07/23/2019   TRIG 267.0 (H) 05/31/2019   Lab Results  Component Value Date   CHOLHDL 7 03/09/2021   CHOLHDL 5 07/23/2019   CHOLHDL 6 05/31/2019   Lab Results  Component Value Date   LDLDIRECT 131.0 05/31/2019   The 10-year ASCVD risk score Mikey Bussing DC Jr., et al., 2013) is: 38.5%   Values used to calculate the score:     Age: 43 years     Sex: Male     Is Non-Hispanic African American: No     Diabetic: Yes     Tobacco smoker: No     Systolic Blood Pressure: 123XX123 mmHg     Is BP treated: No     HDL Cholesterol: 30.8 mg/dL     Total Cholesterol: 211 mg/dL I have reviewed the PMH, Fam and Soc  history. Patient Active Problem List   Diagnosis Date Noted   Obstructive sleep apnea 03/09/2021    Home sleep test 2020; cpap recommended. [facility]     Diastolic CHF with preserved left ventricular function, NYHA class 2 ([name]) 03/09/2021    Bilateral LE edema; Echocardiogram 07/2019    Tricompartment osteoarthritis of left knee 07/25/2019   Complex tear of medial meniscus of left knee as current injury 07/25/2019   Prediabetes 07/24/2019    A1c 6.2 9/020     Other male erectile dysfunction 06/29/2019   Mixed hyperlipidemia 07/11/2018    The 10-year ASCVD risk score Mikey Bussing DC Jr., et al., 2013) is: 15.2%   Values used to calculate the score:     Age: 75 years     Sex: Male     Is Non-Hispanic African American: No     Diabetic: No     Tobacco smoker: No     Systolic Blood Pressure: [id number] mmHg     Is BP treated: No     HDL Cholesterol: 31.6 mg/dL     Total Cholesterol: 216 mg/dL      [place] 04/06/2018   Morbid obesity ([place]) 04/06/2018    Social History: Patient  reports that he has never smoked. He has never used smokeless tobacco. He reports that he does not drink alcohol and does not use drugs.  Review of Systems: Ophthalmic: negative for eye pain, loss of vision or double vision Cardiovascular: negative for chest pain Respiratory: negative for SOB or persistent cough Gastrointestinal: negative for abdominal pain Genitourinary: negative for dysuria or gross hematuria MSK: negative for foot lesions Neurologic: negative for weakness or gait disturbance  Objective  Vitals: BP 122/78   Pulse (!) 58   Temp (!) 97.2 F (36.2 C) (Temporal)   Resp 18   Ht '5\' 6"'$  (1.676 m)   Wt 262 lb 3.2 oz (118.9 kg)   BMI 42.32 kg/m  General: well appearing, no acute distress  Psych:  Alert and oriented, normal mood and affect HEENT:  Normocephalic, atraumatic, moist mucous membranes, supple neck  Cardiovascular:  Nl S1 and S2, RRR without murmur, gallop or rub.  +2 pitting  edema bilateral lower extremities  Respiratory:  Good breath sounds bilaterally, CTAB with normal effort, no rales     Diabetic education: ongoing education regarding chronic disease management for diabetes was given today. We continue to reinforce the ABC's of diabetic management: A1c (<7 or 8 dependent upon patient), tight blood pressure control, and cholesterol management with goal LDL < 100 minimally. We discuss diet strategies, exercise recommendations, medication options and possible side effects. At each visit,  we review recommended immunizations and preventive care recommendations for diabetics and stress that good diabetic control can prevent other problems. See below for this patient's data.   Commons side effects, risks, benefits, and alternatives for medications and treatment plan prescribed today were discussed, and the patient expressed understanding of the given instructions. Patient is instructed to call or message via MyChart if he/she has any questions or concerns regarding our treatment plan. No barriers to understanding were identified. We discussed Red Flag symptoms and signs in detail. Patient expressed understanding regarding what to do in case of urgent or emergency type symptoms.  Medication list was reconciled, printed and provided to the patient in AVS. Patient instructions and summary information was reviewed with the patient as documented in the AVS. This note was prepared with assistance of Dragon voice recognition software. Occasional wrong-word or sound-a-like substitutions may have occurred due to the inherent limitations of voice recognition software  This visit occurred during the SARS-CoV-2 public health emergency.  Safety protocols were in place, including screening questions prior to the visit, additional usage of staff PPE, and extensive cleaning of exam room while observing appropriate contact time as indicated for disinfecting solutions.

## 2021-06-09 NOTE — Patient Instructions (Signed)
Please return in 3 months for your annual complete physical; please come fasting.   Please increase furosemide dose to 10m: take 2 pills together every morning. This should help with your swelling.   I will release your lab results to you on your MyChart account with further instructions. Please reply with any questions.    I recommend the Cologuard test for your colon cancer screening that is due. I have ordered this test for you. The [facility] contact you to verify your insurance, address etc. They will then send you the kit; follow the instructions in the kit and return the kit to Cologuard. They will run the test and send the results to me. I will then give you the results. If this test is negative, we recommend repeating a colon cancer screening test in 3 years. If it is positive, I will refer you to a Gastroenterologist so you can get set up for the recommended colonoscopy.  Thank you!   If you have any questions or concerns, please don't hesitate to send me a message via MyChart or call the office at [phone] Thank you for visiting with [email or web address]! It's our pleasure caring for you.

## 2021-06-16 DIAGNOSIS — Z1211 Encounter for screening for malignant neoplasm of colon: Secondary | ICD-10-CM | POA: Diagnosis not present

## 2021-06-16 DIAGNOSIS — Z1212 Encounter for screening for malignant neoplasm of rectum: Secondary | ICD-10-CM | POA: Diagnosis not present

## 2021-06-19 ENCOUNTER — Telehealth: Payer: Self-pay | Admitting: Pulmonary Disease

## 2021-06-19 DIAGNOSIS — G4733 Obstructive sleep apnea (adult) (pediatric): Secondary | ICD-10-CM

## 2021-06-19 NOTE — Telephone Encounter (Signed)
Call patient  Sleep study result  Date of study: 06/07/2021  Impression: Mild obstructive sleep apnea Mild oxygen desaturations Study findings similar to previous study  Recommendation:  Options of treatment for mild sleep disordered breathing will include   1.  CPAP therapy if there is significant daytime sleepiness or other comorbidities including history of CVA or cardiac disease -If CPAP is chosen as an option of treatment auto titrating CPAP with a pressure setting of 5-15 will be appropriate   2.  Watchful waiting with emphasis on weight loss, sleep position modification to optimize lateral sleep, elevating the head of the bed by about 30 degrees may also help   3.  An oral device may be fashioned for the treatment of mild sleep disordered breathing, will involve referral to dentist.

## 2021-06-22 NOTE — Telephone Encounter (Signed)
ATC, no VM set up to leave VM

## 2021-06-24 LAB — COLOGUARD: Cologuard: POSITIVE — AB

## 2021-06-25 ENCOUNTER — Telehealth: Payer: Self-pay | Admitting: Family Medicine

## 2021-06-25 NOTE — Telephone Encounter (Signed)
Attempted to schedule AWV. Unable to LVM.  Will try at later time.   Called patient to schedule Annual Wellness Visit.  Please schedule with Nurse Health Advisor Charlott Rakes, RN at [facility].  Please call [phone] ask for [facility]

## 2021-06-26 NOTE — Telephone Encounter (Signed)
Pt is returning phone call in regards to sleep results. Pls regard; [phone].

## 2021-06-28 DIAGNOSIS — Z20822 Contact with and (suspected) exposure to covid-19: Secondary | ICD-10-CM | POA: Diagnosis not present

## 2021-06-29 NOTE — Telephone Encounter (Signed)
Spoke to patient and relayed below message/recommendations.  Patient is agreeable with cpap. Order has been placed. Recall placed for 32mo Nothing further needed at this time.

## 2021-06-29 NOTE — Telephone Encounter (Signed)
Patient is returning phone call. Patient phone number is [phone].

## 2021-06-29 NOTE — Progress Notes (Signed)
Please call patient: I have reviewed his/her lab results. Cologuard colon cancer screening test result is positive. This means there could be an abnormality in the colon like a precancerous polyp or cancer. Therefore, he needs a colonoscopy. Please explain and refer to GI.

## 2021-06-30 ENCOUNTER — Other Ambulatory Visit: Payer: Self-pay

## 2021-06-30 DIAGNOSIS — R195 Other fecal abnormalities: Secondary | ICD-10-CM

## 2021-09-01 ENCOUNTER — Other Ambulatory Visit: Payer: Self-pay | Admitting: Family Medicine

## 2021-09-01 DIAGNOSIS — G4733 Obstructive sleep apnea (adult) (pediatric): Secondary | ICD-10-CM | POA: Diagnosis not present

## 2021-10-02 ENCOUNTER — Other Ambulatory Visit: Payer: Self-pay | Admitting: Family Medicine

## 2021-11-12 DIAGNOSIS — H52209 Unspecified astigmatism, unspecified eye: Secondary | ICD-10-CM | POA: Diagnosis not present

## 2021-11-12 DIAGNOSIS — H524 Presbyopia: Secondary | ICD-10-CM | POA: Diagnosis not present

## 2021-11-12 DIAGNOSIS — H5203 Hypermetropia, bilateral: Secondary | ICD-10-CM | POA: Diagnosis not present

## 2021-11-13 DIAGNOSIS — H5203 Hypermetropia, bilateral: Secondary | ICD-10-CM | POA: Diagnosis not present

## 2022-03-25 ENCOUNTER — Ambulatory Visit (INDEPENDENT_AMBULATORY_CARE_PROVIDER_SITE_OTHER): Payer: Medicare HMO | Admitting: Family Medicine

## 2022-03-25 ENCOUNTER — Other Ambulatory Visit: Payer: Self-pay | Admitting: Family Medicine

## 2022-03-25 ENCOUNTER — Ambulatory Visit (INDEPENDENT_AMBULATORY_CARE_PROVIDER_SITE_OTHER): Payer: Medicare HMO

## 2022-03-25 ENCOUNTER — Encounter: Payer: Self-pay | Admitting: Family Medicine

## 2022-03-25 VITALS — BP 110/72 | HR 74 | Temp 98.6°F | Wt 264.8 lb

## 2022-03-25 VITALS — BP 118/70 | HR 59 | Temp 98.1°F | Ht 66.0 in | Wt 264.8 lb

## 2022-03-25 DIAGNOSIS — I5030 Unspecified diastolic (congestive) heart failure: Secondary | ICD-10-CM

## 2022-03-25 DIAGNOSIS — R7303 Prediabetes: Secondary | ICD-10-CM | POA: Diagnosis not present

## 2022-03-25 DIAGNOSIS — R040 Epistaxis: Secondary | ICD-10-CM | POA: Diagnosis not present

## 2022-03-25 DIAGNOSIS — G5711 Meralgia paresthetica, right lower limb: Secondary | ICD-10-CM

## 2022-03-25 DIAGNOSIS — Z0000 Encounter for general adult medical examination without abnormal findings: Secondary | ICD-10-CM

## 2022-03-25 DIAGNOSIS — E782 Mixed hyperlipidemia: Secondary | ICD-10-CM | POA: Diagnosis not present

## 2022-03-25 MED ORDER — ATORVASTATIN CALCIUM 10 MG PO TABS: ORAL_TABLET | ORAL | 1 refills | Status: DC

## 2022-03-25 MED ORDER — GABAPENTIN 300 MG PO CAPS: 300.0000 mg | ORAL_CAPSULE | Freq: Every day | ORAL | 0 refills | Status: DC

## 2022-03-25 MED ORDER — POTASSIUM CHLORIDE CRYS ER 20 MEQ PO TBCR: 20.0000 meq | EXTENDED_RELEASE_TABLET | Freq: Every day | ORAL | 1 refills | Status: DC

## 2022-03-25 MED ORDER — FUROSEMIDE 20 MG PO TABS: 40.0000 mg | ORAL_TABLET | Freq: Every day | ORAL | 1 refills | Status: DC

## 2022-03-25 MED ORDER — FLUTICASONE PROPIONATE 50 MCG/ACT NA SUSP: 1.0000 | Freq: Every day | NASAL | 6 refills | Status: DC

## 2022-03-25 NOTE — Progress Notes (Signed)
Subjective  CC:  Chief Complaint  Patient presents with   Leg Pain    Pt stated that he has had Rt leg pain for the past 39mo. If feels like needles sticking him. Pt also stated that he had a nose bleed on 03/19/2022   Epistaxis    HPI: Roy Harris a 71y.o. male who presents to the office today to address the problems listed above in the chief complaint. Right anterior thigh paresthesias with pins and needles sensation x 6 mnths. No back pain or radiation of pain. No leg swelling. No trauma.  Had a nose bleed a few days ago. Stopped with pressure after a few minutes.  HLD on statin: ran out a few weeks ago. Tolerates well HTN w/ leg swelling. Ran out of lasix and potassium. Feeling well. Taking medications w/o adverse effects. No symptoms of CHF, angina; no palpitations, sob, cp or lower extremity edema. Compliant with meds.  Assessment  1. Epistaxis   2. Meralgia paresthetica of right side   3. Diastolic CHF with preserved left ventricular function, NYHA class 2 (HCC) Chronic  4. Morbid obesity (HCC) Chronic  5. Prediabetes   6. Mixed hyperlipidemia      Plan  epistaxis:  nasal exam clear except for rhinorrhea and inflammation: start flonase and education on how to manage nose bleeds given.  Meralgia paresthetica: education. Neurontin Heart failure. On lasix. Check renal and lytes. Refilled meds H/o prediabetes. Recheck a1c Hld due for lipid panel on statin.   Follow up: 3 mo for cpe  Visit date not found  Orders Placed This Encounter  Procedures   CBC with Differential/Platelet   Comprehensive metabolic panel   Hemoglobin A1c   Lipid panel   Meds ordered this encounter  Medications   fluticasone (FLONASE) 50 MCG/ACT nasal spray    Sig: Place 1 spray into both nostrils daily.    Dispense:  16 g    Refill:  6   gabapentin (NEURONTIN) 300 MG capsule    Sig: Take 1 capsule (300 mg total) by mouth at bedtime.    Dispense:  90 capsule    Refill:  0    furosemide (LASIX) 20 MG tablet    Sig: Take 2 tablets (40 mg total) by mouth daily. Take with potassium    Dispense:  90 tablet    Refill:  1   potassium chloride SA (KLOR-CON M) 20 MEQ tablet    Sig: Take 1 tablet (20 mEq total) by mouth daily. Take with lasix    Dispense:  90 tablet    Refill:  1   atorvastatin (LIPITOR) 10 MG tablet    Sig: TAKE 1 TABLET(10 MG) BY MOUTH EVERY NIGHT AT BEDTIME    Dispense:  90 tablet    Refill:  1      I reviewed the patients updated PMH, FH, and SocHx.    Patient Active Problem List   Diagnosis Date Noted   Obstructive sleep apnea 053/29/9242  Diastolic CHF with preserved left ventricular function, NYHA class 2 ([facility] 03/09/2021   Tricompartment osteoarthritis of left knee 07/25/2019   Complex tear of medial meniscus of left knee as current injury 07/25/2019   Prediabetes 07/24/2019   Other male erectile dysfunction 06/29/2019   Mixed hyperlipidemia 07/11/2018   Lentigo 04/06/2018   Morbid obesity ([facility] 04/06/2018   Current Meds  Medication Sig   aspirin EC 81 MG tablet Take 1 tablet (81 mg total) by mouth daily.  fluticasone (FLONASE) 50 MCG/ACT nasal spray Place 1 spray into both nostrils daily.   gabapentin (NEURONTIN) 300 MG capsule Take 1 capsule (300 mg total) by mouth at bedtime.   sildenafil (VIAGRA) 100 MG tablet Take 0.5-1 tablets (50-100 mg total) by mouth as needed for erectile dysfunction.   [DISCONTINUED] atorvastatin (LIPITOR) 10 MG tablet TAKE 1 TABLET(10 MG) BY MOUTH EVERY NIGHT AT BEDTIME   [DISCONTINUED] furosemide (LASIX) 20 MG tablet Take 2 tablets (40 mg total) by mouth daily. Take with potassium   [DISCONTINUED] potassium chloride SA (KLOR-CON) 20 MEQ tablet Take 1 tablet (20 mEq total) by mouth daily. Take with lasix    Allergies: Patient has No Known Allergies. Family History: Patient family history includes Alcohol abuse in his father; COPD in his mother; Clotting disorder in his sister; Diabetes in his  daughter; Healthy in his son; Kidney disease in his brother; Stroke in his father. Social History:  Patient  reports that he has never smoked. He has never used smokeless tobacco. He reports that he does not drink alcohol and does not use drugs.  Review of Systems: Constitutional: Negative for fever malaise or anorexia Cardiovascular: negative for chest pain Respiratory: negative for SOB or persistent cough Gastrointestinal: negative for abdominal pain  Objective  Vitals: BP 118/70   Pulse (!) 59   Temp 98.1 F (36.7 C)   Ht '5\' 6"'$  (1.676 m)   Wt 264 lb 12.8 oz (120.1 kg)   SpO2 97%   BMI 42.74 kg/m  General: no acute distress , A&Ox3 HEENT: PEERL, conjunctiva normal, neck is supple. Nasal mucosa with erythema and swelling. No bleeding or clots present Cardiovascular:  RRR without murmur or gallop.  Respiratory:  Good breath sounds bilaterally, CTAB with normal respiratory effort Legs: tr edema bilaterally Skin:  Warm, no rashes    Commons side effects, risks, benefits, and alternatives for medications and treatment plan prescribed today were discussed, and the patient expressed understanding of the given instructions. Patient is instructed to call or message via MyChart if he/she has any questions or concerns regarding our treatment plan. No barriers to understanding were identified. We discussed Red Flag symptoms and signs in detail. Patient expressed understanding regarding what to do in case of urgent or emergency type symptoms.  Medication list was reconciled, printed and provided to the patient in AVS. Patient instructions and summary information was reviewed with the patient as documented in the AVS. This note was prepared with assistance of Dragon voice recognition software. Occasional wrong-word or sound-a-like substitutions may have occurred due to the inherent limitations of voice recognition software  This visit occurred during the SARS-CoV-2 public health emergency.   Safety protocols were in place, including screening questions prior to the visit, additional usage of staff PPE, and extensive cleaning of exam room while observing appropriate contact time as indicated for disinfecting solutions.

## 2022-03-25 NOTE — Patient Instructions (Addendum)
[name] , Thank you for taking time to come for your Medicare Wellness Visit. I appreciate your ongoing commitment to your health goals. Please review the following plan we discussed and let me know if I can assist you in the future.   Screening recommendations/referrals: Colonoscopy: cologuard 06/16/21 repeat a Colonoscopy every 3 years  Recommended yearly ophthalmology/optometry visit for glaucoma screening and checkup Recommended yearly dental visit for hygiene and checkup  Vaccinations: Influenza vaccine: Due and discussed  Pneumococcal vaccine: due  Tdap vaccine: Done 07/07/18 repeat every 10 years  Shingles vaccine: Shingrix discussed. Please contact your pharmacy for coverage information.    Covid-19: Completed 2/13, 01/15/20  Advanced directives: Advance directive discussed with you today. I have provided a copy for you to complete at home and have notarized. Once this is complete please bring a copy in to our office so we can scan it into your chart.   Conditions/risks identified: lose weight   Next appointment: Follow up in one year for your annual wellness visit.   Preventive Care 13 Years and Older, Male Preventive care refers to lifestyle choices and visits with your health care provider that can promote health and wellness. What does preventive care include? A yearly physical exam. This is also called an annual well check. Dental exams once or twice a year. Routine eye exams. Ask your health care provider how often you should have your eyes checked. Personal lifestyle choices, including: Daily care of your teeth and gums. Regular physical activity. Eating a healthy diet. Avoiding tobacco and drug use. Limiting alcohol use. Practicing safe sex. Taking low doses of aspirin every day. Taking vitamin and mineral supplements as recommended by your health care provider. What happens during an annual well check? The services and screenings done by your health care  provider during your annual well check will depend on your age, overall health, lifestyle risk factors, and family history of disease. Counseling  Your health care provider may ask you questions about your: Alcohol use. Tobacco use. Drug use. Emotional well-being. Home and relationship well-being. Sexual activity. Eating habits. History of falls. Memory and ability to understand (cognition). Work and work Statistician. Screening  You may have the following tests or measurements: Height, weight, and BMI. Blood pressure. Lipid and cholesterol levels. These may be checked every 5 years, or more frequently if you are over 85 years old. Skin check. Lung cancer screening. You may have this screening every year starting at age 62 if you have a 30-pack-year history of smoking and currently smoke or have quit within the past 15 years. Fecal occult blood test (FOBT) of the stool. You may have this test every year starting at age 34. Flexible sigmoidoscopy or colonoscopy. You may have a sigmoidoscopy every 5 years or a colonoscopy every 10 years starting at age 43. Prostate cancer screening. Recommendations will vary depending on your family history and other risks. Hepatitis C blood test. Hepatitis B blood test. Sexually transmitted disease (STD) testing. Diabetes screening. This is done by checking your blood sugar (glucose) after you have not eaten for a while (fasting). You may have this done every 1-3 years. Abdominal aortic aneurysm (AAA) screening. You may need this if you are a current or former smoker. Osteoporosis. You may be screened starting at age 22 if you are at high risk. Talk with your health care provider about your test results, treatment options, and if necessary, the need for more tests. Vaccines  Your health care provider may recommend certain  vaccines, such as: Influenza vaccine. This is recommended every year. Tetanus, diphtheria, and acellular pertussis (Tdap, Td)  vaccine. You may need a Td booster every 10 years. Zoster vaccine. You may need this after age 50. Pneumococcal 13-valent conjugate (PCV13) vaccine. One dose is recommended after age 110. Pneumococcal polysaccharide (PPSV23) vaccine. One dose is recommended after age 13. Talk to your health care provider about which screenings and vaccines you need and how often you need them. This information is not intended to replace advice given to you by your health care provider. Make sure you discuss any questions you have with your health care provider. Document Released: 11/21/2015 Document Revised: 07/14/2016 Document Reviewed: 08/26/2015 Elsevier Interactive Patient Education  2017 [employer] Prevention in the Home Falls can cause injuries. They can happen to people of all ages. There are many things you can do to make your home safe and to help prevent falls. What can I do on the outside of my home? Regularly fix the edges of walkways and driveways and fix any cracks. Remove anything that might make you trip as you walk through a door, such as a raised step or threshold. Trim any bushes or trees on the path to your home. Use bright outdoor lighting. Clear any walking paths of anything that might make someone trip, such as rocks or tools. Regularly check to see if handrails are loose or broken. Make sure that both sides of any steps have handrails. Any raised decks and porches should have guardrails on the edges. Have any leaves, snow, or ice cleared regularly. Use sand or salt on walking paths during winter. Clean up any spills in your garage right away. This includes oil or grease spills. What can I do in the bathroom? Use night lights. Install grab bars by the toilet and in the tub and shower. Do not use towel bars as grab bars. Use non-skid mats or decals in the tub or shower. If you need to sit down in the shower, use a plastic, non-slip stool. Keep the floor dry. Clean up any  water that spills on the floor as soon as it happens. Remove soap buildup in the tub or shower regularly. Attach bath mats securely with double-sided non-slip rug tape. Do not have throw rugs and other things on the floor that can make you trip. What can I do in the bedroom? Use night lights. Make sure that you have a light by your bed that is easy to reach. Do not use any sheets or blankets that are too big for your bed. They should not hang down onto the floor. Have a firm chair that has side arms. You can use this for support while you get dressed. Do not have throw rugs and other things on the floor that can make you trip. What can I do in the kitchen? Clean up any spills right away. Avoid walking on wet floors. Keep items that you use a lot in easy-to-reach places. If you need to reach something above you, use a strong step stool that has a grab bar. Keep electrical cords out of the way. Do not use floor polish or wax that makes floors slippery. If you must use wax, use non-skid floor wax. Do not have throw rugs and other things on the floor that can make you trip. What can I do with my stairs? Do not leave any items on the stairs. Make sure that there are handrails on both sides of the stairs  and use them. Fix handrails that are broken or loose. Make sure that handrails are as long as the stairways. Check any carpeting to make sure that it is firmly attached to the stairs. Fix any carpet that is loose or worn. Avoid having throw rugs at the top or bottom of the stairs. If you do have throw rugs, attach them to the floor with carpet tape. Make sure that you have a light switch at the top of the stairs and the bottom of the stairs. If you do not have them, ask someone to add them for you. What else can I do to help prevent falls? Wear shoes that: Do not have high heels. Have rubber bottoms. Are comfortable and fit you well. Are closed at the toe. Do not wear sandals. If you use a  stepladder: Make sure that it is fully opened. Do not climb a closed stepladder. Make sure that both sides of the stepladder are locked into place. Ask someone to hold it for you, if possible. Clearly mark and make sure that you can see: Any grab bars or handrails. First and last steps. Where the edge of each step is. Use tools that help you move around (mobility aids) if they are needed. These include: Canes. Walkers. Scooters. Crutches. Turn on the lights when you go into a dark area. Replace any light bulbs as soon as they burn out. Set up your furniture so you have a clear path. Avoid moving your furniture around. If any of your floors are uneven, fix them. If there are any pets around you, be aware of where they are. Review your medicines with your doctor. Some medicines can make you feel dizzy. This can increase your chance of falling. Ask your doctor what other things that you can do to help prevent falls. This information is not intended to replace advice given to you by your health care provider. Make sure you discuss any questions you have with your health care provider. Document Released: 08/21/2009 Document Revised: 04/01/2016 Document Reviewed: 11/29/2014 Elsevier Interactive Patient Education  2017 [employer].

## 2022-03-25 NOTE — Patient Instructions (Addendum)
Please return in 3 months for your annual complete physical; please come fasting.   I will release your lab results to you on your MyChart account with further instructions. You may see the results before I do, but when I review them I will send you a message with my report or have my assistant call you if things need to be discussed. Please reply to my message with any questions. Thank you!   Please call the gastroenterologist to set up an appointment to discuss colonoscopy because your cologuard test was positive.  [facility] [phone] option 1  If you have any questions or concerns, please don't hesitate to send me a message via MyChart or call the office at [phone]. Thank you for visiting with [place] today! It's our pleasure caring for you.

## 2022-03-25 NOTE — Progress Notes (Addendum)
Subjective:   Roy Harris is a 71 y.o. male who presents for Medicare Annual/Subsequent preventive examination.  Review of Systems     Cardiac Risk Factors include: advanced age (>23mn, >>27women);male gender;dyslipidemia;obesity (BMI >30kg/m2)     Objective:    Today's Vitals   03/25/22 1013  BP: 110/72  Pulse: 74  Temp: 98.6 F (37 C)  SpO2: 96%  Weight: 264 lb 12.8 oz (120.1 kg)   Body mass index is 42.74 kg/m.     03/25/2022   10:29 AM 01/21/2020    9:27 AM 08/06/2019    2:31 PM  Advanced Directives  Does Patient Have a Medical Advance Directive? No No No  Would patient like information on creating a medical advance directive? Yes (MAU/Ambulatory/Procedural Areas - Information given) Yes (MAU/Ambulatory/Procedural Areas - Information given) No - Patient declined    Current Medications (verified) Outpatient Encounter Medications as of 03/25/2022  Medication Sig   aspirin EC 81 MG tablet Take 1 tablet (81 mg total) by mouth daily.   atorvastatin (LIPITOR) 10 MG tablet TAKE 1 TABLET(10 MG) BY MOUTH EVERY NIGHT AT BEDTIME   furosemide (LASIX) 20 MG tablet Take 2 tablets (40 mg total) by mouth daily. Take with potassium   potassium chloride SA (KLOR-CON) 20 MEQ tablet Take 1 tablet (20 mEq total) by mouth daily. Take with lasix   sildenafil (VIAGRA) 100 MG tablet Take 0.5-1 tablets (50-100 mg total) by mouth as needed for erectile dysfunction.   [DISCONTINUED] acetaminophen-codeine (TYLENOL #3) 300-30 MG tablet Take 1 tablet by mouth every 6 (six) hours as needed for moderate pain or severe pain. (Patient not taking: Reported on 03/25/2022)   No facility-administered encounter medications on file as of 03/25/2022.    Allergies (verified) Patient has no known allergies.   History: Past Medical History:  Diagnosis Date   Diastolic CHF with preserved left ventricular function, NYHA class 2 ([place] 03/09/2021   Left ventricular diastolic dysfunction 91/04/2693   Bilateral LE edema; Echocardiogram 07/2019   Mixed hyperlipidemia 07/11/2018   Prediabetes 07/24/2019   A1c 6.2 9/020   History reviewed. No pertinent surgical history. Family History  Problem Relation Age of Onset   COPD Mother    Stroke Father    Alcohol abuse Father    Clotting disorder Sister    Diabetes Daughter    Healthy Son    Kidney disease Brother    Cancer Neg Hx    Social History   Socioeconomic History   Marital status: Married    Spouse name: Not on file   Number of children: 3   Years of education: Not on file   Highest education level: Not on file  Occupational History   Occupation: [employer], fAmbulance personRETIRED  Tobacco Use   Smoking status: Never   Smokeless tobacco: Never  Vaping Use   Vaping Use: Never used  Substance and Sexual Activity   Alcohol use: Never   Drug use: Never   Sexual activity: Yes  Other Topics Concern   Not on file  Social History Narrative   [place]; [place]. 3 children. [place] in [place] in [place]   Social Determinants of Health   Financial Resource Strain: Low Risk    Difficulty of Paying Living Expenses: Not hard at all  Food Insecurity: No Food Insecurity   Worried About RCharity fundraiserin the Last Year: Never true   RArboriculturistin  the Last Year: Never true  Transportation Needs: No Transportation Needs   Lack of Transportation (Medical): No   Lack of Transportation (Non-Medical): No  Physical Activity: Inactive   Days of Exercise per Week: 0 days   Minutes of Exercise per Session: 0 min  Stress: No Stress Concern Present   Feeling of Stress : Not at all  Social Connections: Moderately Integrated   Frequency of Communication with Friends and Family: More than three times a week   Frequency of Social Gatherings with Friends and Family: Not on file   Attends Religious Services: More than 4 times per year   Active Member of [employer] or Organizations: No   Attends  Music therapist: Never   Marital Status: Married    Tobacco Counseling Counseling given: Not Answered   Clinical Intake:  Pre-visit preparation completed: Yes  Pain : No/denies pain     BMI - recorded: 42.74 Nutritional Status: BMI > 30  Obese Nutritional Risks: None Diabetes: No  How often do you need to have someone help you when you read instructions, pamphlets, or other written materials from your doctor or pharmacy?: 1 - Never  Diabetic?no  Interpreter Needed?: No  Information entered by :: Charlott Rakes, LPN   Activities of Daily Living    03/25/2022   10:30 AM 06/09/2021    8:28 AM  In your present state of health, do you have any difficulty performing the following activities:  Hearing? 0 0  Vision? 0 0  Difficulty concentrating or making decisions? 0 0  Walking or climbing stairs? 0 0  Dressing or bathing? 0 0  Doing errands, shopping? 0 0  Preparing Food and eating ? N   Using the Toilet? N   In the past six months, have you accidently leaked urine? N   Do you have problems with loss of bowel control? N   Managing your Medications? N   Managing your Finances? N   Housekeeping or managing your Housekeeping? N     Patient Care Team: [name], MD as PCP - General (Family Medicine) Laurin Coder, MD as Consulting Physician (Pulmonary Disease)  Indicate any recent Medical Services you may have received from other than [facility] providers in the past year (date may be approximate).     Assessment:   This is a routine wellness examination for [name].  Hearing/Vision screen Hearing Screening - Comments:: Pt denies any hearing issues  Vision Screening - Comments:: Pt [facility] with [facility] for [facility] eye exams   Dietary issues and exercise activities discussed: Current Exercise Habits: The patient does not participate in regular exercise at present   Goals Addressed             This Visit's Progress    Patient Stated        Lose weight        Depression Screen    03/25/2022   10:28 AM 06/09/2021    8:27 AM 03/09/2021    1:22 PM 01/21/2020    9:28 AM 07/23/2019    8:57 AM 04/06/2018    9:40 AM 04/06/2018    9:37 AM  PHQ 2/9 Scores  PHQ - 2 Score 0 0 0 0 0 0 0  PHQ- 9 Score  0    0 0    Fall Risk    03/25/2022   10:30 AM 06/09/2021    8:27 AM 03/09/2021    1:22 PM 01/21/2020    9:28 AM 07/23/2019  8:[place] in the past year? 0 0 0 0 0  Number falls in past yr: 0 0 0 0 0  Injury with Fall? 0 0 0 0 0  Risk for fall due to : Impaired vision;Impaired mobility No Fall Risks     Follow up Falls prevention discussed   Falls evaluation completed;Education provided;Falls prevention discussed Falls evaluation completed    FALL RISK PREVENTION PERTAINING TO THE HOME:  Any stairs in or around the home? Yes  If so, are there any without handrails? No  Home free of loose throw rugs in walkways, pet beds, electrical cords, etc? Yes  Adequate lighting in your home to reduce risk of falls? Yes   ASSISTIVE DEVICES UTILIZED TO PREVENT FALLS:  Life alert? No  Use of a cane, walker or w/c? No  Grab bars in the bathroom? No  Shower chair or bench in shower? No  Elevated toilet seat or a handicapped toilet? No   TIMED UP AND GO:  Was the test performed? Yes .  Length of time to ambulate 10 feet: 10 sec.   Gait steady and fast without use of assistive device  Cognitive Function:        03/25/2022   10:31 AM 01/21/2020    9:28 AM  6CIT Screen  What Year? 0 points 0 points  What month? 0 points 0 points  What time? 0 points 0 points  Count back from 20 0 points 0 points  Months in reverse 2 points 0 points  Repeat phrase 2 points 2 points  Total Score 4 points 2 points    Immunizations Immunization History  Administered Date(s) Administered   Fluad Quad(high Dose 65+) 07/23/2019   Influenza, High Dose Seasonal PF 07/07/2018   PFIZER(Purple Top)SARS-COV-2 Vaccination 12/22/2019,  01/15/2020   Pneumococcal Conjugate-13 03/09/2021   Tdap 07/07/2018    TDAP status: Up to date  Flu Vaccine status: Due, Education has been provided regarding the importance of this vaccine. Advised may receive this vaccine at local pharmacy or Health Dept. Aware to provide a copy of the vaccination record if obtained from local pharmacy or Health Dept. Verbalized acceptance and understanding.  Pneumococcal vaccine status: Due, Education has been provided regarding the importance of this vaccine. Advised may receive this vaccine at local pharmacy or Health Dept. Aware to provide a copy of the vaccination record if obtained from local pharmacy or Health Dept. Verbalized acceptance and understanding.  Covid-19 vaccine status: Completed vaccines  Qualifies for Shingles Vaccine? Yes   Zostavax completed No   Shingrix Completed?: No.    Education has been provided regarding the importance of this vaccine. Patient has been advised to call insurance company to determine out of pocket expense if they have not yet received this vaccine. Advised may also receive vaccine at local pharmacy or Health Dept. Verbalized acceptance and understanding.  Screening Tests Health Maintenance  Topic Date Due   Zoster Vaccines- Shingrix (1 of 2) Never done   COVID-19 Vaccine (3 - Booster for Pfizer series) 03/11/2020   Pneumonia Vaccine 38+ Years old (2 - PPSV23 if available, else PCV20) 03/09/2022   URINE MICROALBUMIN  03/09/2022   INFLUENZA VACCINE  06/08/2022   Fecal DNA (Cologuard)  06/16/2024   TETANUS/TDAP  07/07/2028   Hepatitis C Screening  Completed   HPV VACCINES  Aged Out    Health Maintenance  Health Maintenance Due  Topic Date Due   Zoster Vaccines- Shingrix (1 of 2)  Never done   COVID-19 Vaccine (3 - Booster for Pfizer series) 03/11/2020   Pneumonia Vaccine 34+ Years old (2 - PPSV23 if available, else PCV20) 03/09/2022   URINE MICROALBUMIN  03/09/2022    Colorectal cancer screening:  Type of screening: Cologuard. Completed 06/16/21. Repeat every 3 years  Additional Screening:  Hepatitis C Screening:  Completed 07/07/18  Vision Screening: Recommended annual ophthalmology exams for early detection of glaucoma and other disorders of the eye. Is the patient up to date with their annual eye exam?  Yes  Who is the provider or what is the name of the office in which the patient attends annual eye exams? [facility] If pt is not established with a provider, would they like to be referred to a provider to establish care? No .   Dental Screening: Recommended annual dental exams for proper oral hygiene  Community Resource Referral / Chronic Care Management: CRR required this visit?  No   CCM required this visit?  No      Plan:     I have personally reviewed and noted the following in the patient's chart:   Medical and social history Use of alcohol, tobacco or illicit drugs  Current medications and supplements including opioid prescriptions. Patient is not currently taking opioid prescriptions. Functional ability and status Nutritional status Physical activity Advanced directives List of other physicians Hospitalizations, surgeries, and ER visits in previous 12 months Vitals Screenings to include cognitive, depression, and falls Referrals and appointments  In addition, I have reviewed and discussed with patient certain preventive protocols, quality metrics, and best practice recommendations. A written personalized care plan for preventive services as well as general preventive health recommendations were provided to patient.     Willette Brace, LPN   4/40/3474   Nurse Notes: pt is requesting refills on medications also stating he had a nose bleed this past week  and has been having pain in right leg he is concerned about pt has appt today to follow up

## 2022-06-30 ENCOUNTER — Ambulatory Visit (INDEPENDENT_AMBULATORY_CARE_PROVIDER_SITE_OTHER): Payer: Medicare HMO | Admitting: Family Medicine

## 2022-06-30 ENCOUNTER — Encounter: Payer: Self-pay | Admitting: Family Medicine

## 2022-06-30 ENCOUNTER — Encounter: Payer: Self-pay | Admitting: Gastroenterology

## 2022-06-30 VITALS — BP 124/64 | HR 65 | Temp 98.3°F | Ht 66.0 in | Wt 266.4 lb

## 2022-06-30 DIAGNOSIS — Z23 Encounter for immunization: Secondary | ICD-10-CM

## 2022-06-30 DIAGNOSIS — R7303 Prediabetes: Secondary | ICD-10-CM

## 2022-06-30 DIAGNOSIS — I5030 Unspecified diastolic (congestive) heart failure: Secondary | ICD-10-CM | POA: Diagnosis not present

## 2022-06-30 DIAGNOSIS — Z0000 Encounter for general adult medical examination without abnormal findings: Secondary | ICD-10-CM | POA: Diagnosis not present

## 2022-06-30 DIAGNOSIS — E782 Mixed hyperlipidemia: Secondary | ICD-10-CM | POA: Diagnosis not present

## 2022-06-30 DIAGNOSIS — G4733 Obstructive sleep apnea (adult) (pediatric): Secondary | ICD-10-CM | POA: Diagnosis not present

## 2022-06-30 DIAGNOSIS — R195 Other fecal abnormalities: Secondary | ICD-10-CM

## 2022-06-30 DIAGNOSIS — G5711 Meralgia paresthetica, right lower limb: Secondary | ICD-10-CM

## 2022-06-30 MED ORDER — SHINGRIX 50 MCG/0.5ML IM SUSR: 0.5000 mL | Freq: Once | INTRAMUSCULAR | 0 refills | Status: AC

## 2022-06-30 NOTE — Progress Notes (Signed)
Subjective  Chief Complaint  Patient presents with   Annual Exam    Pt here for annual Exam and is not currently fasting     HPI: Roy Harris is a 71 y.o. male who presents to [facility] [place] today for a Male Wellness Visit. He also has the concerns and/or needs as listed above in the chief complaint. These will be addressed in addition to the Health Maintenance Visit.   Wellness Visit: annual visit with health maintenance review and exam   HM: Had positive Cologuard in May of last year but never got scheduled with GI.  They have left messages.  He denies melena or abdominal pain or weight loss.  He is eligible for Pneumovax today and Shingrix vaccinations.  He is due an eye exam.  He reports he feels well.  He has no current concerns.  He is overdue for lab work  Body mass index is 43 kg/m. Wt Readings from Last 3 Encounters:  06/30/22 266 lb 6.4 oz (120.8 kg)  03/25/22 264 lb 12.8 oz (120.1 kg)  03/25/22 264 lb 12.8 oz (120.1 kg)     Chronic disease management visit and/or acute problem visit: Hyperlipidemia on Crestor 10.  He reports he takes it nightly without problems. Diastolic heart failure: Uses Lasix with potassium and this controls his lower extremity edema.  No shortness of breath or chest pain or palpitations Prediabetes: He denies symptoms of hyperglycemia.  His weight is persistently elevated. He has meralgia paresthetica on right: Mild symptoms and on gabapentin.  Sleeps well   Patient Active Problem List   Diagnosis Date Noted   Obstructive sleep apnea 60/63/0160   Diastolic CHF with preserved left ventricular function, NYHA class 2 ([place]) 03/09/2021   Tricompartment osteoarthritis of left knee 07/25/2019   Complex tear of medial meniscus of left knee as current injury 07/25/2019   Prediabetes 07/24/2019   Other male erectile dysfunction 06/29/2019   Mixed hyperlipidemia 07/11/2018   Lentigo 04/06/2018   Morbid obesity ([place])  04/06/2018   Health Maintenance  Topic Date Due   Zoster Vaccines- Shingrix (1 of 2) Never done   Pneumonia Vaccine 78+ Years old (2 - PPSV23 or PCV20) 03/09/2022   URINE MICROALBUMIN  03/09/2022   INFLUENZA VACCINE  06/08/2022   COVID-19 Vaccine (3 - Pfizer series) 07/16/2022 (Originally 03/11/2020)   Fecal DNA (Cologuard)  06/16/2024   TETANUS/TDAP  07/07/2028   Hepatitis C Screening  Completed   HPV VACCINES  Aged Out   Immunization History  Administered Date(s) Administered   Fluad Quad(high Dose 65+) 07/23/2019   Influenza, High Dose Seasonal PF 07/07/2018   PFIZER(Purple Top)SARS-COV-2 Vaccination 12/22/2019, 01/15/2020   Pneumococcal Conjugate-13 03/09/2021   Tdap 07/07/2018   We updated and reviewed the patient's past history in detail and it is documented below. Allergies: Patient has No Known Allergies. Past Medical History  has a past medical history of Diastolic CHF with preserved left ventricular function, NYHA class 2 ([place]) (03/09/2021), Left ventricular diastolic dysfunction (11/16/3233), Mixed hyperlipidemia (07/11/2018), and Prediabetes (07/24/2019). Past Surgical History Patient  has no past surgical history on file. Social History Patient  reports that he has never smoked. He has never used smokeless tobacco. He reports that he does not drink alcohol and does not use drugs. Family History family history includes Alcohol abuse in his father; COPD in his mother; Clotting disorder in his sister; Diabetes in his daughter; Healthy in his son; Kidney disease in his brother; Stroke in  his father. Review of Systems: Constitutional: negative for fever or malaise Ophthalmic: negative for photophobia, double vision or loss of vision Cardiovascular: negative for chest pain, dyspnea on exertion, or new LE swelling Respiratory: negative for SOB or persistent cough Gastrointestinal: negative for abdominal pain, change in bowel habits or melena Genitourinary: negative for dysuria or  gross hematuria Musculoskeletal: negative for new gait disturbance or muscular weakness Integumentary: negative for new or persistent rashes Neurological: negative for TIA or stroke symptoms Psychiatric: negative for SI or delusions Allergic/Immunologic: negative for hives  Patient Care Team    Relationship Specialty Notifications Start End  [name], MD PCP - General Family Medicine  04/06/18   Laurin Coder, MD Consulting Physician Pulmonary Disease  07/23/19    Objective  Vitals: BP 124/64   Pulse 65   Temp 98.3 F (36.8 C)   Ht '5\' 6"'$  (1.676 m)   Wt 266 lb 6.4 oz (120.8 kg)   SpO2 96%   BMI 43.00 kg/m  General:  Well developed, well nourished, no acute distress  Psych:  Alert and orientedx3,normal mood and affect HEENT:  Normocephalic, atraumatic, non-icteric sclera, PERRL, oropharynx is clear without mass or exudate, supple neck without adenopathy, mass or thyromegaly Cardiovascular:  Normal S1, S2, RRR without gallop, rub or murmur, +2 distal pulses in bilateral upper and lower extremities. Respiratory:  Good breath sounds bilaterally, CTAB with normal respiratory effort Gastrointestinal: normal bowel sounds, soft, non-tender, no noted masses. No HSM MSK: no deformities, contusions. Joints are without erythema or swelling. Spine and CVA region are nontender Skin:  Warm, no rashes or suspicious lesions noted    Assessment  1. Annual physical exam   2. Morbid obesity ([place])   3. Mixed hyperlipidemia   4. Prediabetes   5. Obstructive sleep apnea   6. Diastolic CHF with preserved left ventricular function, NYHA class 2 ([place])   7. Positive colorectal cancer screening using Cologuard test      Plan  Male Wellness Visit: Age appropriate Health Maintenance and Prevention measures were discussed with patient. Included topics are cancer screening recommendations, ways to keep healthy (see AVS) including dietary and exercise recommendations, regular eye and dental care,  use of seat belts, and avoidance of moderate alcohol use and tobacco use.  BMI: discussed patient's BMI and encouraged positive lifestyle modifications to help get to or maintain a target BMI. HM needs and immunizations were addressed and ordered. See below for orders. See HM and immunization section for updates.  Pneumovax updated today.  Prescription for Shingrix and counseling given. Routine labs and screening tests ordered including cmp, cbc and lipids where appropriate. Discussed recommendations regarding Vit D and calcium supplementation (see AVS)  Chronic disease f/u and/or acute problem visit: (deemed necessary to be done in addition to the wellness visit): Positive Cologuard testing done last year: Again discussed meaning of this positive test and need for colonoscopy.  A new referral placed to [facility].  I gave him the phone number so he can follow-up on this.  Patient says he is willing to get it done. Hyperlipidemia on Crestor 10: Recheck fasting lipids he will return tomorrow for lab draw.  Check LFTs Diastolic heart failure on Lasix: Edema is controlled.  No new symptoms.  Monitor electrolytes and thyroid function History of prediabetes: We will recheck A1c.  He denies symptoms of hyperglycemia.  Recommend healthy diet.  Recommend eye exam.  Follow up: No follow-ups on file.  Commons side effects, risks, benefits, and alternatives  for medications and treatment plan prescribed today were discussed, and the patient expressed understanding of the given instructions. Patient is instructed to call or message via MyChart if he/she has any questions or concerns regarding our treatment plan. No barriers to understanding were identified. We discussed Red Flag symptoms and signs in detail. Patient expressed understanding regarding what to do in case of urgent or emergency type symptoms.  Medication list was reconciled, printed and provided to the patient in AVS. Patient instructions and summary  information was reviewed with the patient as documented in the AVS. This note was prepared with assistance of Dragon voice recognition software. Occasional wrong-word or sound-a-like substitutions may have occurred due to the inherent limitations of voice recognition software  This visit occurred during the SARS-CoV-2 public health emergency.  Safety protocols were in place, including screening questions prior to the visit, additional usage of staff PPE, and extensive cleaning of exam room while observing appropriate contact time as indicated for disinfecting solutions.   Orders Placed This Encounter  Procedures   CBC with Differential/Platelet   Comprehensive metabolic panel   Hemoglobin A1c   Lipid panel   TSH   Microalbumin / creatinine urine ratio   Ambulatory referral to Gastroenterology   Meds ordered this encounter  Medications   Zoster Vaccine Adjuvanted [facility]) injection    Sig: Inject 0.5 mLs into the muscle once for 1 dose. Please give 2nd dose 2-6 months after first dose    Dispense:  2 each    Refill:  0

## 2022-06-30 NOTE — Patient Instructions (Addendum)
Please return in 6 months for recheck. Please schedule lab visit tomorrow for your lab work. Come fasting.   I will release your lab results to you on your MyChart account with further instructions. You may see the results before I do, but when I review them I will send you a message with my report or have my assistant call you if things need to be discussed. Please reply to my message with any questions. Thank you!   Today you were given your pneumovax vaccine. This protects your from certain types of pneumonia infections.   Please take the prescription for Shingrix to the pharmacy so they may administer the vaccinations. Your insurance will then cover the injections.   Please call [facility] to get scheduled for your colonoscopy consult because your cologuard screening test was positive last year. [phone]  If you have any questions or concerns, please don't hesitate to send me a message via MyChart or call the office at [phone]. Thank you for visiting with [place] today! It's our pleasure caring for you.

## 2022-07-01 ENCOUNTER — Other Ambulatory Visit (INDEPENDENT_AMBULATORY_CARE_PROVIDER_SITE_OTHER): Payer: Medicare HMO

## 2022-07-01 ENCOUNTER — Telehealth: Payer: Self-pay | Admitting: *Deleted

## 2022-07-01 DIAGNOSIS — I5030 Unspecified diastolic (congestive) heart failure: Secondary | ICD-10-CM

## 2022-07-01 DIAGNOSIS — R7303 Prediabetes: Secondary | ICD-10-CM

## 2022-07-01 DIAGNOSIS — E782 Mixed hyperlipidemia: Secondary | ICD-10-CM | POA: Diagnosis not present

## 2022-07-01 DIAGNOSIS — Z0000 Encounter for general adult medical examination without abnormal findings: Secondary | ICD-10-CM | POA: Diagnosis not present

## 2022-07-01 LAB — CBC WITH DIFFERENTIAL/PLATELET
Basophils Absolute: 0.1 10*3/uL (ref 0.0–0.1)
Basophils Relative: 1.1 % (ref 0.0–3.0)
Eosinophils Absolute: 0.1 10*3/uL (ref 0.0–0.7)
Eosinophils Relative: 1 % (ref 0.0–5.0)
HCT: 45.6 % (ref 39.0–52.0)
Hemoglobin: 15.1 g/dL (ref 13.0–17.0)
Lymphocytes Relative: 17.2 % (ref 12.0–46.0)
Lymphs Abs: 1.6 10*3/uL (ref 0.7–4.0)
MCHC: 33.2 g/dL (ref 30.0–36.0)
MCV: 89.5 fl (ref 78.0–100.0)
Monocytes Absolute: 0.7 10*3/uL (ref 0.1–1.0)
Monocytes Relative: 8 % (ref 3.0–12.0)
Neutro Abs: 6.6 10*3/uL (ref 1.4–7.7)
Neutrophils Relative %: 72.7 % (ref 43.0–77.0)
Platelets: 160 10*3/uL (ref 150.0–400.0)
RBC: 5.09 Mil/uL (ref 4.22–5.81)
RDW: 13 % (ref 11.5–15.5)
WBC: 9 10*3/uL (ref 4.0–10.5)

## 2022-07-01 LAB — COMPREHENSIVE METABOLIC PANEL
ALT: 31 U/L (ref 0–53)
AST: 22 U/L (ref 0–37)
Albumin: 4.1 g/dL (ref 3.5–5.2)
Alkaline Phosphatase: 80 U/L (ref 39–117)
BUN: 19 mg/dL (ref 6–23)
CO2: 25 mEq/L (ref 19–32)
Calcium: 9.5 mg/dL (ref 8.4–10.5)
Chloride: 104 mEq/L (ref 96–112)
Creatinine, Ser: 0.98 mg/dL (ref 0.40–1.50)
GFR: 77.2 mL/min (ref >60.00)
Glucose, Bld: 148 mg/dL — ABNORMAL HIGH (ref 70–99)
Potassium: 4.1 mEq/L (ref 3.5–5.1)
Sodium: 137 mEq/L (ref 135–145)
Total Bilirubin: 1.7 mg/dL — ABNORMAL HIGH (ref 0.2–1.2)
Total Protein: 7.2 g/dL (ref 6.0–8.3)

## 2022-07-01 LAB — LIPID PANEL
Cholesterol: 134 mg/dL (ref 0–200)
HDL: 31.6 mg/dL — ABNORMAL LOW (ref >39.00)
LDL Cholesterol: 72 mg/dL (ref 0–99)
NonHDL: 102.63
Total CHOL/HDL Ratio: 4
Triglycerides: 155 mg/dL — ABNORMAL HIGH (ref 0.0–149.0)
VLDL: 31 mg/dL (ref 0.0–40.0)

## 2022-07-01 LAB — HEMOGLOBIN A1C: Hgb A1c MFr Bld: 7.3 % — ABNORMAL HIGH (ref 4.6–6.5)

## 2022-07-01 LAB — TSH: TSH: 1.96 u[IU]/mL (ref 0.35–5.50)

## 2022-07-01 NOTE — Telephone Encounter (Signed)
Noted  

## 2022-07-01 NOTE — Progress Notes (Signed)
Please call patient: I have reviewed his/her lab results. His labwork shows that he is now in the diabetic range. I recommend starting metformin '500mg'$  bid. Please order for him and he needs a f/u visit in 3 months to recheck.  Cholesterol is ok. Keep on the medications . Needs to eat a diabetic diet.  Can send to nutritionist if he'd like May ask for a liver ultrasound in the future: ? Fatty liver with elevated bilirubin.

## 2022-07-01 NOTE — Telephone Encounter (Signed)
Freda Munro,  This pt is cleared for anesthetic care at [facility].   Thanks,  JMN

## 2022-07-01 NOTE — Telephone Encounter (Signed)
Please review chart and advise if can be done in [place]?

## 2022-07-02 ENCOUNTER — Telehealth: Payer: Self-pay | Admitting: Family Medicine

## 2022-07-02 DIAGNOSIS — G4733 Obstructive sleep apnea (adult) (pediatric): Secondary | ICD-10-CM | POA: Diagnosis not present

## 2022-07-02 NOTE — Telephone Encounter (Signed)
Patient has called in stating he has missed phone call.  I have read patient Dr. Tamela Oddi response to recent labs.  Patient understood.  Please make sure to send metformin to pharmacy; order ultrasound; send referral for nutritionist.    I have advised for patient to follow back up on medication by EOD Monday and Ultrasound and referral by next Friday if he has not heard anything.

## 2022-07-05 ENCOUNTER — Other Ambulatory Visit: Payer: Self-pay

## 2022-07-05 DIAGNOSIS — E118 Type 2 diabetes mellitus with unspecified complications: Secondary | ICD-10-CM

## 2022-07-05 MED ORDER — METFORMIN HCL 500 MG PO TABS: 500.0000 mg | ORAL_TABLET | Freq: Two times a day (BID) | ORAL | 3 refills | Status: DC

## 2022-07-05 NOTE — Telephone Encounter (Signed)
I have spoken with pt and Rx has been sent to pharmacy and Referral has been placed

## 2022-07-13 ENCOUNTER — Other Ambulatory Visit: Payer: Self-pay | Admitting: Family Medicine

## 2022-07-14 ENCOUNTER — Ambulatory Visit (AMBULATORY_SURGERY_CENTER): Payer: Self-pay | Admitting: *Deleted

## 2022-07-14 VITALS — Ht 66.0 in | Wt 265.0 lb

## 2022-07-14 DIAGNOSIS — Z1211 Encounter for screening for malignant neoplasm of colon: Secondary | ICD-10-CM

## 2022-07-14 MED ORDER — PEG 3350-KCL-NA BICARB-NACL 420 G PO SOLR: 4000.0000 mL | Freq: Once | ORAL | 0 refills | Status: AC

## 2022-07-14 MED ORDER — GABAPENTIN 300 MG PO CAPS
300.0000 mg | ORAL_CAPSULE | Freq: Every day | ORAL | 3 refills | Status: DC
Start: 2022-07-14 — End: 2022-12-13

## 2022-07-14 NOTE — Progress Notes (Signed)
No egg or soy allergy known to patient  No issues known to pt with past sedation with any surgeries or procedures Patient denies ever being told they had issues or difficulty with intubation  No FH of Malignant Hyperthermia Pt is not on diet pills Pt is not on home 02  Pt is not on blood thinners  Pt denies issues with constipation  No A fib or A flutter Have any cardiac testing pending--NO Pt instructed to use [email or web address] or GoodRx for a price reduction on prep   

## 2022-07-21 ENCOUNTER — Encounter: Payer: Medicare HMO | Attending: Family Medicine | Admitting: Dietician

## 2022-07-21 ENCOUNTER — Encounter: Payer: Self-pay | Admitting: Gastroenterology

## 2022-07-21 ENCOUNTER — Encounter: Payer: Self-pay | Admitting: Dietician

## 2022-07-21 DIAGNOSIS — E119 Type 2 diabetes mellitus without complications: Secondary | ICD-10-CM | POA: Diagnosis not present

## 2022-07-21 NOTE — Patient Instructions (Addendum)
Aim to get 150 minutes of physical activity weekly. -consider walking after dinner or in the morning  Tips for increasing water intake: -Keep a glass of water by your bedside so that you're encouraged to drink it upon waking -Carry a re-usable, re-fillable water bottle -Add fruit such as lemon, lime, berries, or cucumbers to your water -Try sparkling water- spindrift  -Make herbal tea and drink it hot or iced  Eat more Non-Starchy Vegetables These include greens, broccoli, cauliflower, cabbage, carrots, beets, eggplant, peppers, squash and others.  Minimize added sugars and refined grains Rethink what you drink.  Choose beverages without added sugar.  Look for 0 carbs on the label. Limit soda, orange juice, and sweet tea Order unsweet tea and add splenda instead of sweet tea  Choose whole foods over processed.  Make simple meals at home more often than eating out.  Make 1/2 of your plate vegetables at least 2 times per day.

## 2022-07-21 NOTE — Progress Notes (Signed)
Diabetes Self-Management Education  Visit Type: First/Initial  Appt. Start Time: 1512 Appt. End Time: 1610  07/21/2022  Roy Harris, identified by name and date of birth, is a 71 y.o. male with a diagnosis of Diabetes: Type 2.   ASSESSMENT  Patient is here today with an interpretor.  Primary Concern: Patient would like to learn about how to eat with diabetes while maintaining his regular lifestyle.   History includes:  Type 2 diabetes, sleep apnea, HLD, CHF Labs noted:  A1C 7.3% 07/01/22 Medications include:  metformin   Patient lives with his wife. Pts wife also has diabetes. His wife make traditional [place] food.  Pt states him and his wife do the shopping and cooking, but they tend to eat out about half of the week.If they are out of the house they may go to [employer], [employer], or [employer].   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 He states this makes it more difficult for him to exercise and eat healthy because he often has very long drives.   Weight 265 lb (120.2 kg). Body mass index is 42.77 kg/m.   Diabetes Self-Management Education - 07/21/22 1519       Visit Information   Visit Type First/Initial      Initial Visit   Diabetes Type Type 2    Date Diagnosed 07/01/22    Are you currently following a meal plan? No    Are you taking your medications as prescribed? Yes      Health Coping   How would you rate your overall health? Good      Psychosocial Assessment   Patient Belief/Attitude about Diabetes Motivated to manage diabetes    What is the hardest part about your diabetes right now, causing you the most concern, or is the most worrisome to you about your diabetes?   Checking blood sugar    Self-care barriers None    Self-management support Doctor's office    Other persons present Interpreter;Patient    Patient Concerns Monitoring    Special Needs None    Preferred Learning Style No preference  indicated    Learning Readiness Ready    How often do you need to have someone help you when you read instructions, pamphlets, or other written materials from your doctor or pharmacy? 2 - Rarely    What is the last grade level you completed in school? masters degree      Pre-Education Assessment   Patient understands the diabetes disease and treatment process. Needs Instruction    Patient understands incorporating nutritional management into lifestyle. Needs Instruction    Patient undertands incorporating physical activity into lifestyle. Needs Instruction    Patient understands using medications safely. Needs Instruction    Patient understands monitoring blood glucose, interpreting and using results Needs Instruction    Patient understands prevention, detection, and treatment of acute complications. Needs Instruction    Patient understands prevention, detection, and treatment of chronic complications. Needs Instruction    Patient understands how to develop strategies to address psychosocial issues. Needs Instruction    Patient understands how to develop strategies to promote health/change behavior. Needs Instruction      Complications   Last HgB A1C per patient/outside source 7.3 %   07/01/22   How often do you check your blood sugar? 1-2 times/day    Fasting Blood glucose range (mg/dL) 70-129    Postprandial Blood glucose range (mg/dL) 70-129  Number of hypoglycemic episodes per month 0    Have you had a dilated eye exam in the past 12 months? No    Have you had a dental exam in the past 12 months? Yes    Are you checking your feet? No      Dietary Intake   Breakfast none OR white bread    Snack (morning) none    Lunch rice and beans OR spaghetti OR burger king burger and fries    Snack (afternoon) crackers    Dinner rice and beans and chicken OR pork chops    Snack (evening) sandwich with white bread and butter and cheese and ham    Beverage(s) coffee with milk, sweet tea, orange  juice, soda, no water      Activity / Exercise   Activity / Exercise Type ADL's    How many days per week do you exercise? 0    How many minutes per day do you exercise? 0    Total minutes per week of exercise 0      Patient Education   Previous Diabetes Education No    Disease Pathophysiology Definition of diabetes, type 1 and 2, and the diagnosis of diabetes;Factors that contribute to the development of diabetes;Explored patient's options for treatment of their diabetes    Healthy Eating Role of diet in the treatment of diabetes and the relationship between the three main macronutrients and blood glucose level;Plate Method;Reviewed blood glucose goals for pre and post meals and how to evaluate the patients' food intake on their blood glucose level.;Meal timing in regards to the patients' current diabetes medication.;Information on hints to eating out and maintain blood glucose control.;Meal options for control of blood glucose level and chronic complications.    Being Active Role of exercise on diabetes management, blood pressure control and cardiac health.;Helped patient identify appropriate exercises in relation to his/her diabetes, diabetes complications and other health issue.    Medications Reviewed patients medication for diabetes, action, purpose, timing of dose and side effects.;Reviewed medication adjustment guidelines for hyperglycemia and sick days.    Monitoring Identified appropriate SMBG and/or A1C goals.;Daily foot exams;Yearly dilated eye exam    Acute complications Taught prevention, symptoms, and  treatment of hypoglycemia - the 15 rule.;Discussed and identified patients' prevention, symptoms, and treatment of hyperglycemia.    Chronic complications Relationship between chronic complications and blood glucose control;Lipid levels, blood glucose control and heart disease;Assessed and discussed foot care and prevention of foot problems;Dental care;Identified and discussed with  patient  current chronic complications;Retinopathy and reason for yearly dilated eye exams;Nephropathy, what it is, prevention of, the use of ACE, ARB's and early detection of through urine microalbumia.;Reviewed with patient heart disease, higher risk of, and prevention    Diabetes Stress and Support Identified and addressed patients feelings and concerns about diabetes;Worked with patient to identify barriers to care and solutions;Role of stress on diabetes    Lifestyle and Health Coping Lifestyle issues that need to be addressed for better diabetes care      Individualized Goals (developed by patient)   Nutrition General guidelines for healthy choices and portions discussed    Physical Activity Exercise 5-7 days per week;30 minutes per day    Medications take my medication as prescribed    Monitoring  Not Applicable    Problem Solving Eating Pattern    Reducing Risk treat hypoglycemia with 15 grams of carbs if blood glucose less than '70mg'$ /dL;do foot checks daily;examine blood glucose patterns  Health Coping Ask for help with psychological, social, or emotional issues      Post-Education Assessment   Patient understands the diabetes disease and treatment process. Comprehends key points    Patient understands incorporating nutritional management into lifestyle. Comprehends key points    Patient undertands incorporating physical activity into lifestyle. Comprehends key points    Patient understands using medications safely. Demonstrates understanding / competency    Patient understands monitoring blood glucose, interpreting and using results Comprehends key points    Patient understands prevention, detection, and treatment of acute complications. Comprehends key points    Patient understands prevention, detection, and treatment of chronic complications. Comprehends key points    Patient understands how to develop strategies to address psychosocial issues. Comprehends key points    Patient  understands how to develop strategies to promote health/change behavior. Comprehends key points      Outcomes   Expected Outcomes Demonstrated interest in learning. Expect positive outcomes    Future DMSE 3-4 months    Program Status Not Completed             Individualized Plan for Diabetes Self-Management Training:   Learning Objective:  Patient will have a greater understanding of diabetes self-management. Patient education plan is to attend individual and/or group sessions per assessed needs and concerns.   Plan:   Patient Instructions  Aim to get 150 minutes of physical activity weekly. -consider walking after dinner or in the morning  Tips for increasing water intake: -Keep a glass of water by your bedside so that you're encouraged to drink it upon waking -Carry a re-usable, re-fillable water bottle -Add fruit such as lemon, lime, berries, or cucumbers to your water -Try sparkling water- spindrift  -Make herbal tea and drink it hot or iced  Eat more Non-Starchy Vegetables These include greens, broccoli, cauliflower, cabbage, carrots, beets, eggplant, peppers, squash and others.  Minimize added sugars and refined grains Rethink what you drink.  Choose beverages without added sugar.  Look for 0 carbs on the label. Limit soda, orange juice, and sweet tea Order unsweet tea and add splenda instead of sweet tea  Choose whole foods over processed.  Make simple meals at home more often than eating out.  Make 1/2 of your plate vegetables at least 2 times per day.  Expected Outcomes:  Demonstrated interest in learning. Expect positive outcomes  Education material provided: ADA - How to Thrive: A Guide for Your Journey with Diabetes, Meal plan card, My Plate, and Snack sheet  If problems or questions, patient to contact team via:  Phone  Future DSME appointment: 3-4 months

## 2022-08-02 ENCOUNTER — Encounter: Payer: Self-pay | Admitting: *Deleted

## 2022-08-02 DIAGNOSIS — G4733 Obstructive sleep apnea (adult) (pediatric): Secondary | ICD-10-CM | POA: Diagnosis not present

## 2022-08-05 ENCOUNTER — Ambulatory Visit (AMBULATORY_SURGERY_CENTER): Payer: Medicare HMO | Admitting: Gastroenterology

## 2022-08-05 ENCOUNTER — Encounter: Payer: Self-pay | Admitting: Gastroenterology

## 2022-08-05 VITALS — BP 128/71 | HR 71 | Temp 98.6°F | Resp 17 | Ht 66.0 in | Wt 265.0 lb

## 2022-08-05 DIAGNOSIS — D120 Benign neoplasm of cecum: Secondary | ICD-10-CM | POA: Diagnosis not present

## 2022-08-05 DIAGNOSIS — D125 Benign neoplasm of sigmoid colon: Secondary | ICD-10-CM

## 2022-08-05 DIAGNOSIS — D122 Benign neoplasm of ascending colon: Secondary | ICD-10-CM | POA: Diagnosis not present

## 2022-08-05 DIAGNOSIS — D123 Benign neoplasm of transverse colon: Secondary | ICD-10-CM | POA: Diagnosis not present

## 2022-08-05 DIAGNOSIS — D124 Benign neoplasm of descending colon: Secondary | ICD-10-CM

## 2022-08-05 DIAGNOSIS — D127 Benign neoplasm of rectosigmoid junction: Secondary | ICD-10-CM | POA: Diagnosis not present

## 2022-08-05 DIAGNOSIS — R195 Other fecal abnormalities: Secondary | ICD-10-CM

## 2022-08-05 DIAGNOSIS — K5730 Diverticulosis of large intestine without perforation or abscess without bleeding: Secondary | ICD-10-CM | POA: Diagnosis not present

## 2022-08-05 DIAGNOSIS — Z1211 Encounter for screening for malignant neoplasm of colon: Secondary | ICD-10-CM

## 2022-08-05 DIAGNOSIS — D128 Benign neoplasm of rectum: Secondary | ICD-10-CM

## 2022-08-05 LAB — HM COLONOSCOPY

## 2022-08-05 MED ORDER — SODIUM CHLORIDE 0.9 % IV SOLN: 500.0000 mL | Freq: Once | INTRAVENOUS | Status: DC
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
## 2022-08-05 NOTE — Op Note (Signed)
[facility] Patient Name: Roy Harris Procedure Date: 08/05/2022 1:49 PM MRN: [id number] Endoscopist: Gerrit Heck , MD Age: 71 Referring MD:  Date of Birth: Feb 06, 1951 Gender: Male Account #: [id number] Procedure:                Colonoscopy Indications:              Screening for colorectal malignant neoplasm, This                            is the patient's first colonoscopy                           Incidental - Positive Cologuard test in 06/2021.                            Otherwise no overt bleeding that he has noticed. Medicines:                Monitored Anesthesia Care Procedure:                Pre-Anesthesia Assessment:                           - Prior to the procedure, a History and Physical                            was performed, and patient medications and                            allergies were reviewed. The patient's tolerance of                            previous anesthesia was also reviewed. The risks                            and benefits of the procedure and the sedation                            options and risks were discussed with the patient.                            All questions were answered, and informed consent                            was obtained. Prior Anticoagulants: The patient has                            taken no previous anticoagulant or antiplatelet                            agents except for aspirin. ASA Grade Assessment:                            III - A patient with severe systemic disease. After  reviewing the risks and benefits, the patient was                            deemed in satisfactory condition to undergo the                            procedure.                           After obtaining informed consent, the colonoscope                            was passed under direct vision. Throughout the                            procedure, the patient's blood pressure, pulse, and                             oxygen saturations were monitored continuously. The                            Olympus CF-HQ190L [id number]) Colonoscope was                            introduced through the anus and advanced to the the                            cecum, identified by appendiceal orifice and                            ileocecal valve. The colonoscopy was performed                            without difficulty. The patient tolerated the                            procedure well. The quality of the bowel                            preparation was good. The ileocecal valve,                            appendiceal orifice, and rectum were photographed. Scope In: 1:53:25 PM Scope Out: 2:23:51 PM Scope Withdrawal Time: 0 hours 26 minutes 49 seconds  Total Procedure Duration: 0 hours 30 minutes 26 seconds  Findings:                 The perianal and digital rectal examinations were                            normal.                           Six sessile polyps were found in the cecum. The  polyps were 3 to 6 mm in size. These polyps were                            removed with a cold snare. Resection and retrieval                            were complete. Estimated blood loss was minimal.                           12 sessile polyps were found in the transverse                            colon (4) and ascending colon (4). The polyps were                            3 to 8 mm in size. These polyps were removed with a                            cold snare. Resection and retrieval were complete.                            Estimated blood loss was minimal.                           Two sessile polyps were found in the descending                            colon. The polyps were 2 to 5 mm in size. These                            polyps were removed with a cold snare. Resection                            and retrieval were complete. Estimated blood loss                             was minimal.                           Three sessile polyps were found in the rectum (1)                            and sigmoid colon (2). The polyps were 3 to 4 mm in                            size. These polyps were removed with a cold snare.                            Resection and retrieval were complete. Estimated                            blood loss was minimal.  A few small and large-mouthed diverticula were                            found in the sigmoid colon.                           The retroflexed view of the distal rectum and anal                            verge was normal and showed no anal or rectal                            abnormalities. Complications:            No immediate complications. Estimated Blood Loss:     Estimated blood loss was minimal. Impression:               - Six 3 to 6 mm polyps in the cecum, removed with a                            cold snare. Resected and retrieved.                           - 12 3 to 8 mm polyps in the transverse colon and                            in the ascending colon, removed with a cold snare.                            Resected and retrieved.                           - Two 2 to 5 mm polyps in the descending colon,                            removed with a cold snare. Resected and retrieved.                           - Three 3 to 4 mm polyps in the rectum and in the                            sigmoid colon, removed with a cold snare. Resected                            and retrieved.                           - Diverticulosis in the sigmoid colon.                           - The distal rectum and anal verge are normal on                            retroflexion  view. Recommendation:           - Patient has a contact number available for                            emergencies. The signs and symptoms of potential                            delayed complications were discussed with the                             patient. Return to normal activities tomorrow.                            Written discharge instructions were provided to the                            patient.                           - Resume previous diet.                           - Continue present medications.                           - Await pathology results.                           - Repeat colonoscopy for surveillance based on                            pathology results.                           - Return to GI office PRN. Gerrit Heck, MD 08/05/2022 2:31:07 PM

## 2022-08-05 NOTE — Progress Notes (Signed)
VS by DT  Pt's states no medical or surgical changes since previsit or office visit.   Interpreter used today at the [facility] for this pt.  Interpreter's name is- Sheppard Coil

## 2022-08-05 NOTE — Patient Instructions (Addendum)
Resume previous diet. - Continue present medications.  YOU HAD AN ENDOSCOPIC PROCEDURE TODAY: Refer to the procedure report and other information in the discharge instructions given to you for any specific questions about what was found during the examination. If this information does not answer your questions, please call [place] office at [phone] to clarify.   YOU SHOULD EXPECT: Some feelings of bloating in the abdomen. Passage of more gas than usual. Walking can help get rid of the air that was put into your GI tract during the procedure and reduce the bloating. If you had a lower endoscopy (such as a colonoscopy or flexible sigmoidoscopy) you may notice spotting of blood in your stool or on the toilet paper. Some abdominal soreness may be present for a day or two, also.  DIET: Your first meal following the procedure should be a light meal and then it is ok to progress to your normal diet. A half-sandwich or bowl of soup is an example of a good first meal. Heavy or fried foods are harder to digest and may make you feel nauseous or bloated. Drink plenty of fluids but you should avoid alcoholic beverages for 24 hours. If you had a esophageal dilation, please see attached instructions for diet.    ACTIVITY: Your care partner should take you home directly after the procedure. You should plan to take it easy, moving slowly for the rest of the day. You can resume normal activity the day after the procedure however YOU SHOULD NOT DRIVE, use power tools, machinery or perform tasks that involve climbing or major physical exertion for 24 hours (because of the sedation medicines used during the test).   SYMPTOMS TO REPORT IMMEDIATELY: A gastroenterologist can be reached at any hour. Please call [phone]  for any of the following symptoms:  Following lower endoscopy (colonoscopy, flexible sigmoidoscopy) Excessive amounts of blood in the stool  Significant tenderness, worsening of abdominal pains   Swelling of the abdomen that is new, acute  Fever of 100 or higher   USTED TUVO UN PROCEDIMIENTO ENDOSCPICO HOY EN EL [facility]:   Lea el informe del procedimiento que se le entreg para cualquier pregunta especfica sobre lo que se Primary school teacher.  Si el informe del examen no responde a sus preguntas, por favor llame a su gastroenterlogo para aclararlo.  Si usted solicit que no se le den [employer] de lo que se Estate manager/land agent en su procedimiento al [employer] va a cuidar, entonces el informe del procedimiento se ha incluido en un sobre sellado para que usted lo revise despus cuando le sea ms conveniente.   LO QUE PUEDE ESPERAR: Algunas sensaciones de hinchazn en el abdomen.  Puede tener ms gases de lo normal.  El caminar puede ayudarle a eliminar el aire que se le puso en el tracto gastrointestinal durante el procedimiento y reducir la hinchazn.  Si le hicieron una endoscopia inferior (como una colonoscopia o una sigmoidoscopia flexible), podra notar manchas de sangre en las heces fecales o en el papel higinico.  Si se someti a una preparacin intestinal para su procedimiento, es posible que no tenga una evacuacin intestinal normal durante [employer].   Tenga en cuenta:  Es posible que note un poco de irritacin y congestin en la nariz o algn drenaje.  Esto es debido al oxgeno [employer] durante su procedimiento.  No hay que preocuparse y esto debe desaparecer ms o Scientist, research (medical).   SNTOMAS PARA REPORTAR INMEDIATAMENTE:  Despus  de una endoscopia inferior (colonoscopia o sigmoidoscopia flexible):  Cantidades excesivas de sangre en las heces fecales  Sensibilidad significativa o empeoramiento de los dolores abdominales   Hinchazn aguda del abdomen que antes no tena   Fiebre de 100F o ms   Despus de la endoscopia superior (EGD)  Vmitos de Biochemist, clinical o material como caf molido   Dolor en el pecho o dolor debajo de los omplatos que antes no tena    Dolor o dificultad persistente para tragar  Falta de aire que antes no tena   Fiebre de 100F o ms  Heces fecales negras y pegajosas   Para asuntos urgentes o de Freight forwarder, puede comunicarse con un gastroenterlogo a cualquier hora llamando al [phone].  DIETA:  Recomendamos una comida pequea al principio, pero luego puede continuar con su dieta normal.  Tome muchos lquidos, Teacher, adult education las bebidas alcohlicas durante 24 horas.    ACTIVIDAD:  Debe planear tomarse las cosas con calma por el resto del da y no debe CONDUCIR ni usar maquinaria pesada Programmer, applications (debido a los medicamentos de sedacin utilizados durante el examen).     SEGUIMIENTO: Nuestro personal llamar al nmero que aparece en su historial al siguiente da hbil de su procedimiento para ver cmo se siente y para responder cualquier pregunta o inquietud que pueda tener con respecto a la informacin que se le dio despus del procedimiento. Si no podemos contactarle, le dejaremos un mensaje.  Sin embargo, si se siente bien y no tiene Paediatric nurse, no es necesario que nos devuelva la llamada.  Asumiremos que ha regresado a sus actividades diarias normales sin incidentes. Si se le tomaron algunas biopsias, le contactaremos por telfono o por carta en las prximas 3 semanas.  Si no ha sabido [employer] biopsias en el transcurso de 3 semanas, por favor llmenos al [phone].   FIRMAS/CONFIDENCIALIDAD: Usted y/o el acompaante que le cuide han firmado documentos que se ingresarn en su historial mdico electrnico.  Estas firmas atestiguan el hecho de que la informacin anterior

## 2022-08-05 NOTE — Progress Notes (Signed)
To pacu, VSS. Report to [email or web address] 

## 2022-08-06 ENCOUNTER — Telehealth: Payer: Self-pay | Admitting: *Deleted

## 2022-08-06 NOTE — Telephone Encounter (Signed)
  Follow up Call-     08/05/2022    1:18 PM  Call back number  Post procedure Call Back phone  # [phone]  Permission to leave phone message Yes     Patient questions:   V/O from Dr Bryan Lemma to tell the patient that he almost 20 polyps and that the bleeding is expected.  The patient was told to call us if the bleeding gets worse.  Again per Dr Bryan Lemma.

## 2022-08-06 NOTE — Telephone Encounter (Signed)
  Follow up Call-     08/05/2022    1:18 PM  Call back number  Post procedure Call Back phone  # [phone]  Permission to leave phone message Yes     Patient questions:  Do you have a fever, pain , or abdominal swelling? No. Pain Score  0 *  Have you tolerated food without any problems? Yes.    Have you been able to return to your normal activities? Yes.    Do you have any questions about your discharge instructions: Diet   No. Medications  No. Follow up visit  No.  Do you have questions or concerns about your Care? Yes.    Patient states that there is blood in the toilet.  He can see it, and it makes the water red.  No pain.  Passing gas. Will forward to doctor.  Actions: * If pain score is 4 or above: No action needed, pain <4.

## 2022-08-11 ENCOUNTER — Encounter: Payer: Self-pay | Admitting: Gastroenterology

## 2022-08-16 ENCOUNTER — Other Ambulatory Visit: Payer: Self-pay

## 2022-08-16 DIAGNOSIS — D369 Benign neoplasm, unspecified site: Secondary | ICD-10-CM

## 2022-08-23 ENCOUNTER — Telehealth: Payer: Self-pay | Admitting: Genetic Counselor

## 2022-08-23 NOTE — Telephone Encounter (Signed)
Scheduled appt per 10/9 referral. Pt is aware of appt date and time. Pt is aware to arrive 15 mins prior to appt time and to bring and updated insurance card. Pt is aware of appt location.

## 2022-09-16 ENCOUNTER — Other Ambulatory Visit: Payer: Self-pay | Admitting: Family Medicine

## 2022-10-20 ENCOUNTER — Other Ambulatory Visit: Payer: Self-pay | Admitting: Genetic Counselor

## 2022-10-20 ENCOUNTER — Inpatient Hospital Stay: Payer: Medicare HMO

## 2022-10-20 ENCOUNTER — Inpatient Hospital Stay: Payer: Medicare HMO | Attending: Genetic Counselor | Admitting: Genetic Counselor

## 2022-10-20 DIAGNOSIS — K635 Polyp of colon: Secondary | ICD-10-CM

## 2022-10-21 ENCOUNTER — Encounter: Payer: Medicare HMO | Admitting: Dietician

## 2022-10-21 ENCOUNTER — Encounter: Payer: Self-pay | Admitting: *Deleted

## 2022-11-15 IMAGING — DX DG CHEST 2V
2 series · 2 of 2 positions shown · non-contrast
Comparison: None.

CLINICAL DATA: Dyspnea

EXAM:
CHEST - 2 VIEW

[chest pa]
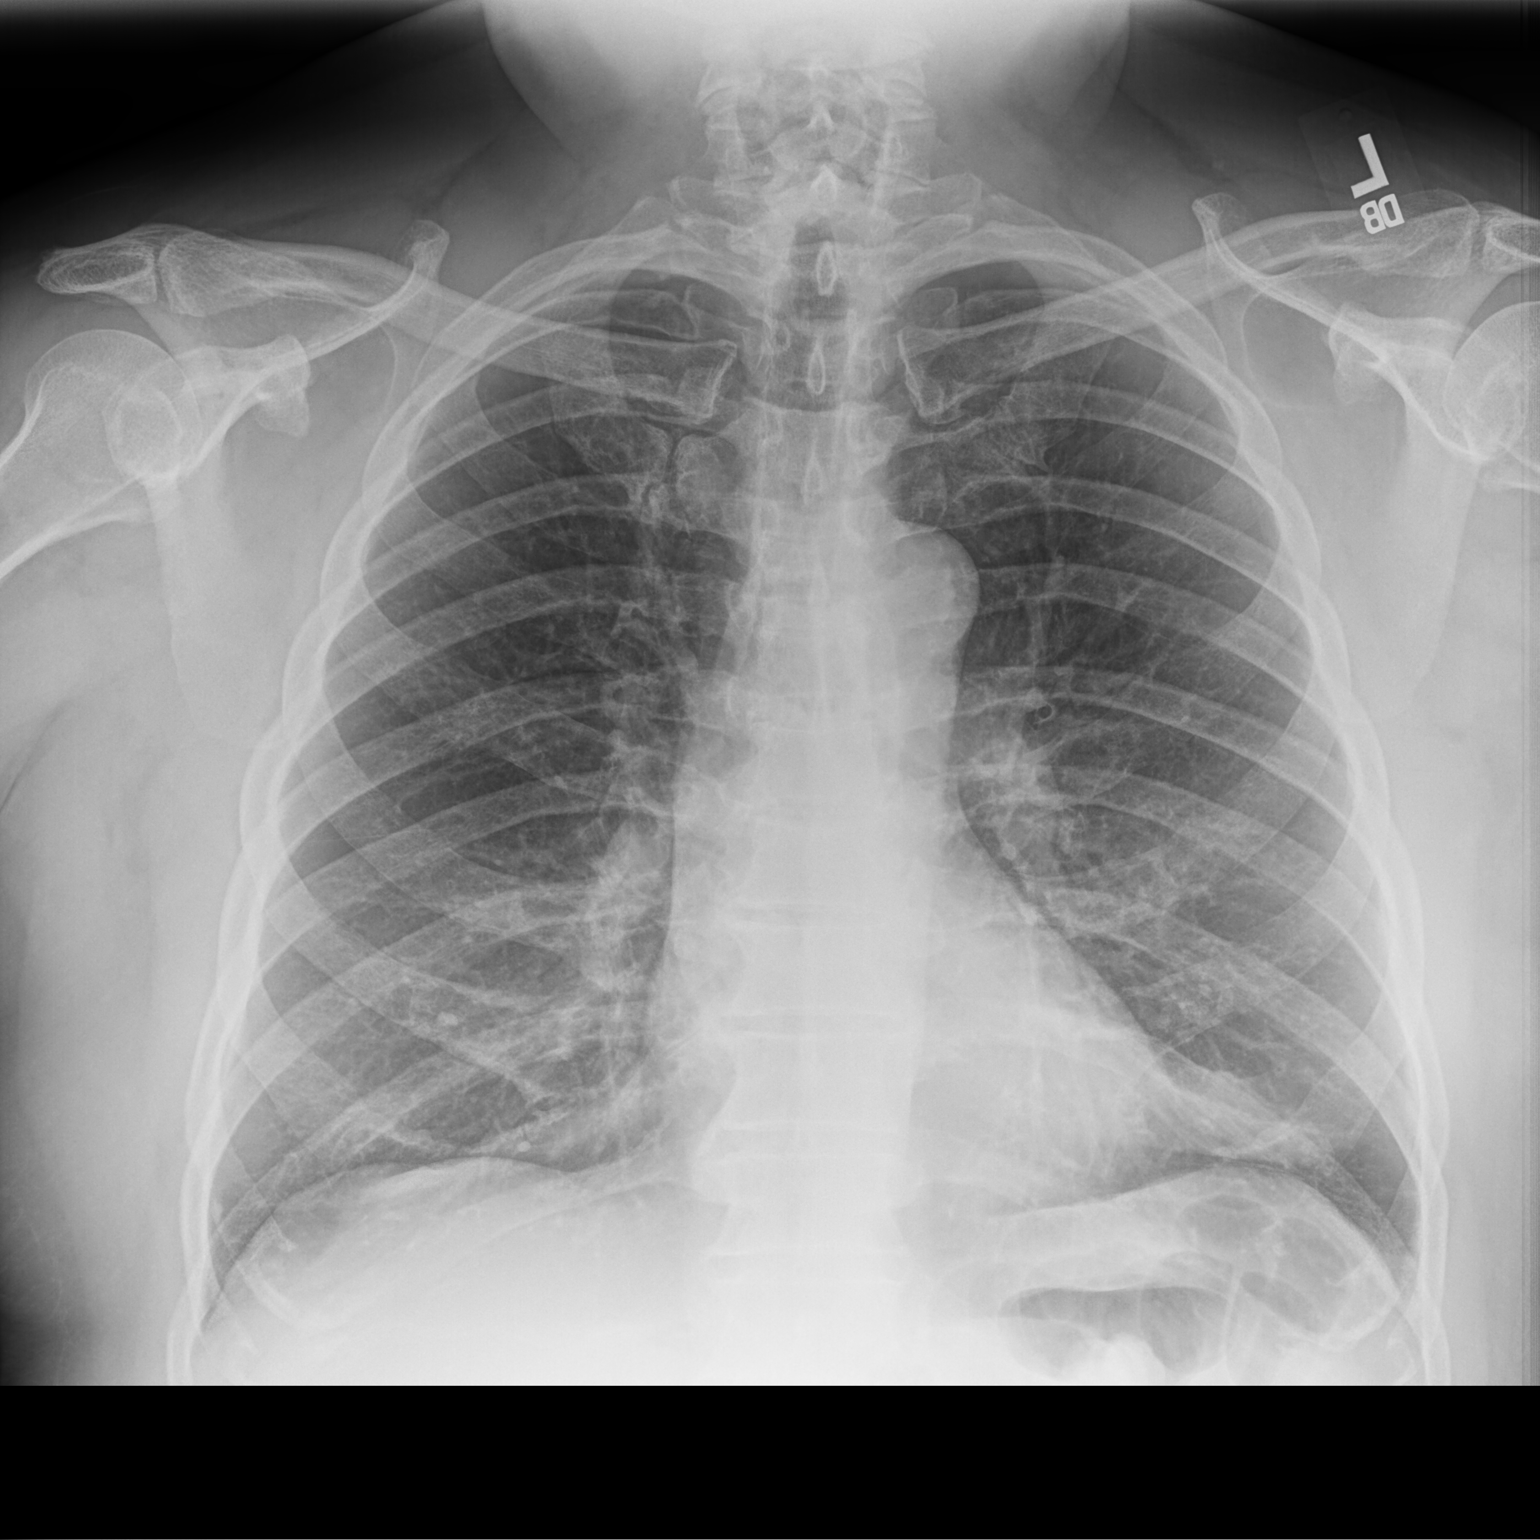

[chest lat]
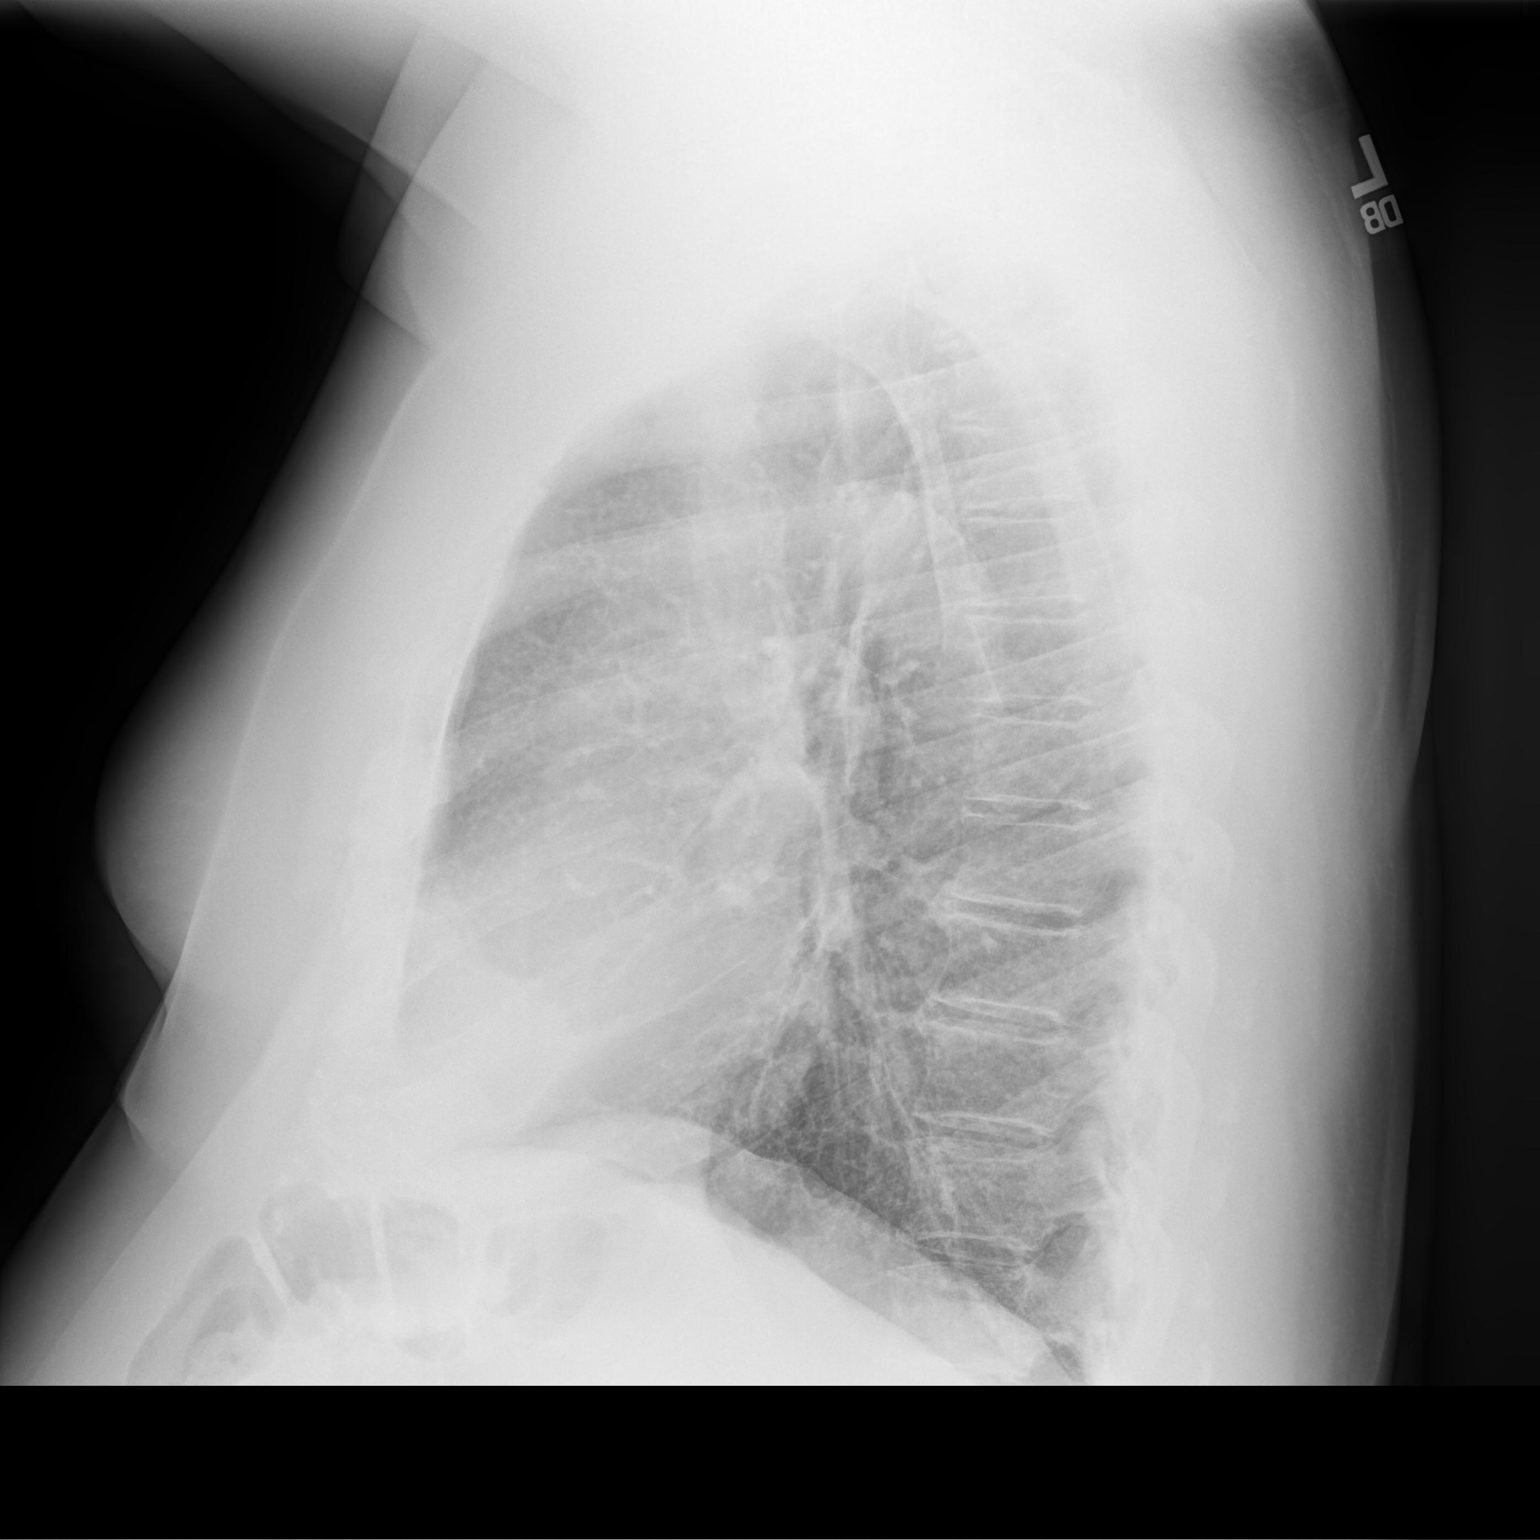

[2 of 2 positions shown; findings below may reference images not displayed]

FINDINGS: Normal heart size. Normal mediastinal contour. No pneumothorax. No
pleural effusion. Lungs appear clear, with no acute consolidative
airspace disease and no pulmonary edema.
IMPRESSION: No active cardiopulmonary disease.

## 2022-11-22 ENCOUNTER — Telehealth: Payer: Self-pay | Admitting: Family Medicine

## 2022-11-22 NOTE — Telephone Encounter (Signed)
Patient needs the following ordered:   airfit v10 nasal pillow headband replacement  Patient states that his replacement broke and [employer] will not provide another one unless ordered by PCP  Patient would like a call back once ordered- Spanish speaking - Leda Quail can call patient.

## 2022-11-23 ENCOUNTER — Other Ambulatory Visit: Payer: Self-pay

## 2022-11-24 NOTE — Telephone Encounter (Signed)
Message sent thru MyChart to let pt know that he will have to contact his pulmonologist to receive the Airfit V10.

## 2022-11-24 NOTE — Telephone Encounter (Addendum)
Error

## 2022-11-25 ENCOUNTER — Telehealth: Payer: Self-pay | Admitting: Pulmonary Disease

## 2022-11-25 DIAGNOSIS — G4733 Obstructive sleep apnea (adult) (pediatric): Secondary | ICD-10-CM

## 2022-11-25 NOTE — Telephone Encounter (Signed)
Patient would like to speak with the nurse regarding his cpap supplies.  He stated that he got a letter that said the doctor needs to send a new script for his supplies.  Please advise and call patient to discuss further.  CB# [phone]

## 2022-11-26 NOTE — Telephone Encounter (Signed)
ATC LMTCB. Order for airfit v10 nasal pillow headband replacement placed as per PCP notes. Nothing further needed but will leave encounter open to f/up with pt.

## 2022-11-29 ENCOUNTER — Telehealth: Payer: Self-pay | Admitting: Pulmonary Disease

## 2022-12-01 ENCOUNTER — Ambulatory Visit (INDEPENDENT_AMBULATORY_CARE_PROVIDER_SITE_OTHER): Payer: Medicare HMO | Admitting: Nurse Practitioner

## 2022-12-01 ENCOUNTER — Encounter: Payer: Self-pay | Admitting: Nurse Practitioner

## 2022-12-01 VITALS — BP 128/78 | HR 68 | Temp 97.3°F | Ht 67.0 in | Wt 263.0 lb

## 2022-12-01 DIAGNOSIS — Z7689 Persons encountering health services in other specified circumstances: Secondary | ICD-10-CM

## 2022-12-01 DIAGNOSIS — I5030 Unspecified diastolic (congestive) heart failure: Secondary | ICD-10-CM

## 2022-12-01 DIAGNOSIS — E782 Mixed hyperlipidemia: Secondary | ICD-10-CM

## 2022-12-01 DIAGNOSIS — E119 Type 2 diabetes mellitus without complications: Secondary | ICD-10-CM

## 2022-12-01 DIAGNOSIS — D126 Benign neoplasm of colon, unspecified: Secondary | ICD-10-CM | POA: Diagnosis not present

## 2022-12-01 DIAGNOSIS — G4733 Obstructive sleep apnea (adult) (pediatric): Secondary | ICD-10-CM

## 2022-12-01 DIAGNOSIS — Z79899 Other long term (current) drug therapy: Secondary | ICD-10-CM

## 2022-12-01 NOTE — Patient Instructions (Signed)
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
## 2022-12-01 NOTE — Telephone Encounter (Signed)
Called patient a left a voicemail telling him he needs to call office back to set up a follow up visit for his cpap supplies. He was last seen in 2022.

## 2022-12-02 ENCOUNTER — Ambulatory Visit: Payer: Medicare HMO | Admitting: Dietician

## 2022-12-02 LAB — LIPID PANEL
Cholesterol: 128 mg/dL (ref <200)
HDL: 37 mg/dL — ABNORMAL LOW (ref >=40)
LDL Cholesterol (Calc): 68 mg/dL (calc)
Non-HDL Cholesterol (Calc): 91 mg/dL (calc) (ref <130)
Total CHOL/HDL Ratio: 3.5 (calc) (ref <5.0)
Triglycerides: 152 mg/dL — ABNORMAL HIGH (ref <150)

## 2022-12-02 LAB — HEMOGLOBIN A1C
Hgb A1c MFr Bld: 6.5 % of total Hgb — ABNORMAL HIGH (ref <5.7)
Mean Plasma Glucose: 140 mg/dL
eAG (mmol/L): 7.7 mmol/L

## 2022-12-02 LAB — COMPLETE METABOLIC PANEL WITH GFR
AG Ratio: 1.4 (calc) (ref 1.0–2.5)
ALT: 27 U/L (ref 9–46)
AST: 20 U/L (ref 10–35)
Albumin: 4.3 g/dL (ref 3.6–5.1)
Alkaline phosphatase (APISO): 83 U/L (ref 35–144)
BUN: 19 mg/dL (ref 7–25)
CO2: 28 mmol/L (ref 20–32)
Calcium: 10.2 mg/dL (ref 8.6–10.3)
Chloride: 104 mmol/L (ref 98–110)
Creat: 0.95 mg/dL (ref 0.70–1.28)
Globulin: 3 g/dL (calc) (ref 1.9–3.7)
Glucose, Bld: 93 mg/dL (ref 65–99)
Potassium: 4.5 mmol/L (ref 3.5–5.3)
Sodium: 139 mmol/L (ref 135–146)
Total Bilirubin: 1.4 mg/dL — ABNORMAL HIGH (ref 0.2–1.2)
Total Protein: 7.3 g/dL (ref 6.1–8.1)
eGFR: 85 mL/min/{1.73_m2} (ref >=60)

## 2022-12-02 LAB — CBC WITH DIFFERENTIAL/PLATELET
Absolute Monocytes: 570 cells/uL (ref 200–950)
Basophils Absolute: 31 cells/uL (ref 0–200)
Basophils Relative: 0.4 %
Eosinophils Absolute: 39 cells/uL (ref 15–500)
Eosinophils Relative: 0.5 %
HCT: 45.4 % (ref 38.5–50.0)
Hemoglobin: 15.1 g/dL (ref 13.2–17.1)
Lymphs Abs: 1486 cells/uL (ref 850–3900)
MCH: 29.5 pg (ref 27.0–33.0)
MCHC: 33.3 g/dL (ref 32.0–36.0)
MCV: 88.8 fL (ref 80.0–100.0)
MPV: 11.3 fL (ref 7.5–12.5)
Monocytes Relative: 7.4 %
Neutro Abs: 5575 cells/uL (ref 1500–7800)
Neutrophils Relative %: 72.4 %
Platelets: 205 10*3/uL (ref 140–400)
RBC: 5.11 10*6/uL (ref 4.20–5.80)
RDW: 12.1 % (ref 11.0–15.0)
Total Lymphocyte: 19.3 %
WBC: 7.7 10*3/uL (ref 3.8–10.8)

## 2022-12-13 ENCOUNTER — Other Ambulatory Visit: Payer: Self-pay | Admitting: Nurse Practitioner

## 2022-12-13 DIAGNOSIS — E118 Type 2 diabetes mellitus with unspecified complications: Secondary | ICD-10-CM

## 2022-12-13 MED ORDER — ATORVASTATIN CALCIUM 10 MG PO TABS: ORAL_TABLET | ORAL | 1 refills | Status: DC

## 2022-12-13 MED ORDER — FUROSEMIDE 20 MG PO TABS: 40.0000 mg | ORAL_TABLET | Freq: Every day | ORAL | 1 refills | Status: DC

## 2022-12-13 MED ORDER — METFORMIN HCL 500 MG PO TABS: 500.0000 mg | ORAL_TABLET | Freq: Two times a day (BID) | ORAL | 3 refills | Status: DC

## 2022-12-13 MED ORDER — SILDENAFIL CITRATE 100 MG PO TABS: 50.0000 mg | ORAL_TABLET | ORAL | 3 refills | Status: DC | PRN

## 2022-12-13 MED ORDER — GABAPENTIN 300 MG PO CAPS: 300.0000 mg | ORAL_CAPSULE | Freq: Every day | ORAL | 3 refills | Status: DC

## 2022-12-13 MED ORDER — FLUTICASONE PROPIONATE 50 MCG/ACT NA SUSP: 1.0000 | Freq: Every day | NASAL | 6 refills | Status: DC

## 2022-12-20 ENCOUNTER — Other Ambulatory Visit: Payer: Self-pay | Admitting: Nurse Practitioner

## 2022-12-20 ENCOUNTER — Telehealth: Payer: Self-pay | Admitting: Nurse Practitioner

## 2022-12-20 MED ORDER — GABAPENTIN 300 MG PO CAPS: 300.0000 mg | ORAL_CAPSULE | Freq: Every day | ORAL | 3 refills | Status: DC

## 2022-12-20 MED ORDER — POTASSIUM CHLORIDE CRYS ER 20 MEQ PO TBCR: 20.0000 meq | EXTENDED_RELEASE_TABLET | Freq: Every day | ORAL | 1 refills | Status: DC

## 2022-12-20 NOTE — Telephone Encounter (Signed)
Pt called saying that Tonya had not sent in his Potassium chloride medication but also that the pharmacy had got all the other medications except for the gabapentin, can we resend that please

## 2022-12-28 ENCOUNTER — Telehealth: Payer: Self-pay | Admitting: Genetic Counselor

## 2022-12-28 NOTE — Telephone Encounter (Signed)
Per 2/20 IB reached out to patient to reschedule appointments, patient answered and call disconnected, will call back.

## 2023-01-04 ENCOUNTER — Ambulatory Visit: Payer: Medicare HMO | Admitting: Nurse Practitioner

## 2023-01-17 ENCOUNTER — Ambulatory Visit: Payer: Medicare HMO | Admitting: Nurse Practitioner

## 2023-01-18 ENCOUNTER — Encounter: Payer: Medicare HMO | Admitting: Genetic Counselor

## 2023-01-18 ENCOUNTER — Other Ambulatory Visit: Payer: Medicare HMO

## 2023-01-20 ENCOUNTER — Encounter: Payer: Self-pay | Admitting: Nurse Practitioner

## 2023-01-20 ENCOUNTER — Ambulatory Visit (INDEPENDENT_AMBULATORY_CARE_PROVIDER_SITE_OTHER): Payer: Medicare HMO | Admitting: Nurse Practitioner

## 2023-01-20 DIAGNOSIS — E118 Type 2 diabetes mellitus with unspecified complications: Secondary | ICD-10-CM

## 2023-01-20 MED ORDER — SILDENAFIL CITRATE 100 MG PO TABS: 50.0000 mg | ORAL_TABLET | ORAL | 3 refills | Status: DC | PRN

## 2023-01-20 MED ORDER — GABAPENTIN 300 MG PO CAPS: 300.0000 mg | ORAL_CAPSULE | Freq: Every day | ORAL | 3 refills | Status: DC

## 2023-01-20 MED ORDER — ATORVASTATIN CALCIUM 10 MG PO TABS: ORAL_TABLET | ORAL | 1 refills | Status: DC

## 2023-01-20 MED ORDER — FUROSEMIDE 20 MG PO TABS: 40.0000 mg | ORAL_TABLET | Freq: Every day | ORAL | 1 refills | Status: DC

## 2023-01-20 MED ORDER — POTASSIUM CHLORIDE CRYS ER 20 MEQ PO TBCR: 20.0000 meq | EXTENDED_RELEASE_TABLET | Freq: Every day | ORAL | 1 refills | Status: DC

## 2023-01-20 MED ORDER — FLUTICASONE PROPIONATE 50 MCG/ACT NA SUSP: 1.0000 | Freq: Every day | NASAL | 6 refills | Status: AC

## 2023-01-20 MED ORDER — METFORMIN HCL 500 MG PO TABS: 500.0000 mg | ORAL_TABLET | Freq: Two times a day (BID) | ORAL | 3 refills | Status: DC
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
## 2023-01-20 NOTE — Patient Instructions (Signed)

## 2023-02-15 ENCOUNTER — Other Ambulatory Visit: Payer: Self-pay

## 2023-02-15 ENCOUNTER — Inpatient Hospital Stay: Payer: Medicare HMO | Attending: Genetic Counselor | Admitting: Genetic Counselor

## 2023-02-15 ENCOUNTER — Inpatient Hospital Stay: Payer: Medicare HMO

## 2023-02-15 DIAGNOSIS — K635 Polyp of colon: Secondary | ICD-10-CM | POA: Diagnosis not present

## 2023-02-15 DIAGNOSIS — Z86010 Personal history of colonic polyps: Secondary | ICD-10-CM | POA: Diagnosis not present

## 2023-02-15 LAB — GENETIC SCREENING ORDER
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
## 2023-03-03 ENCOUNTER — Encounter: Payer: Self-pay | Admitting: Genetic Counselor

## 2023-03-03 ENCOUNTER — Telehealth: Payer: Self-pay | Admitting: Genetic Counselor

## 2023-03-03 DIAGNOSIS — Z1379 Encounter for other screening for genetic and chromosomal anomalies: Secondary | ICD-10-CM | POA: Insufficient documentation

## 2023-03-03 NOTE — Telephone Encounter (Signed)
I attempted to contact Mr. Decesare to discuss his genetic testing results (48 genes). He did not answer and does not have voicemail. We will attempt to contact him at a later date.   Lalla Brothers, MS, [facility] Genetic Counselor [email or web address] (P) [phone]

## 2023-03-14 ENCOUNTER — Ambulatory Visit: Payer: Self-pay | Admitting: Genetic Counselor

## 2023-03-14 ENCOUNTER — Telehealth: Payer: Self-pay | Admitting: Genetic Counselor

## 2023-03-14 ENCOUNTER — Encounter: Payer: Self-pay | Admitting: Genetic Counselor

## 2023-03-14 DIAGNOSIS — Z1379 Encounter for other screening for genetic and chromosomal anomalies: Secondary | ICD-10-CM

## 2023-03-14 NOTE — Telephone Encounter (Signed)
I contacted Roy Harris to discuss his genetic testing results. No pathogenic variants were identified in the 48 genes analyzed. Detailed clinic note to follow.  The test report has been scanned into EPIC and is located under the Molecular Pathology section of the Results Review tab.  A portion of the result report is included below for reference.   Lalla Brothers, MS, [facility] Genetic Counselor [email or web address] (P) [phone]

## 2023-03-14 NOTE — Progress Notes (Signed)
HPI:   Roy Harris was previously seen in the [facility] clinic due to a personal history of colon polyps and concerns regarding a hereditary predisposition to cancer. Please refer to our prior cancer genetics clinic note for more information regarding our discussion, assessment and recommendations, at the time. Roy Harris's recent genetic test results were disclosed to him, as were recommendations warranted by these results. These results and recommendations are discussed in more detail below.  CANCER HISTORY:  Oncology History   No history exists.    FAMILY HISTORY:  We obtained a detailed, 4-generation family history.  Significant diagnoses are listed below:   Roy Harris reportedly had a lump removed from her breast in her 33s but he does not know if the lump was cancerous. There is no known family history of colon polyps and he has limited information about his family history. Roy Harris is unaware of previous family history of genetic testing for hereditary cancer risks. There is no reported Ashkenazi Jewish ancestry.           GENETIC TEST RESULTS:  The [facility] Common Cancer Panel found no pathogenic mutations.  The Common Hereditary Cancers Panel offered by [facility] includes sequencing and/or deletion duplication testing of the following 48 genes: APC, ATM, AXIN2, BAP1, BARD1, BMPR1A, BRCA1, BRCA2, BRIP1, CDH1, CDK4, CDKN2A (p14ARF and p16INK4a only), CHEK2, CTNNA1, DICER1, EPCAM (Deletion/duplication testing only), FH, GREM1 (promoter region duplication testing only), HOXB13, KIT, MBD4, MEN1, MLH1, MSH2, MSH3, MSH6, MUTYH, NF1, NHTL1, PALB2, PDGFRA, PMS2, POLD1, POLE, PTEN, RAD51C, RAD51D, SDHA (sequencing analysis only except exon 14), SDHB, SDHC, SDHD, SMAD4, SMARCA4. STK11, TP53, TSC1, TSC2, and VHL.   The test report has been scanned into EPIC and is located under the Molecular Pathology section of the Results Review tab.  A portion of  the result report is included below for reference. Genetic testing reported out on 02/22/2023.     Even though a pathogenic variant was not identified, possible explanations for his history of colon polyps may include: There may be no hereditary risk for polyposis in the family. His history of colon polyps may be due to other genetic or environmental factors. There may be a gene mutation in one of these genes that current testing methods cannot detect, but that chance is small. There could be another gene that has not yet been discovered, or that we have not yet tested, that is responsible for his colon polyps.   Therefore, it is important to remain in touch with cancer genetics in the future so that we can continue to offer Roy Harris the most up to date genetic testing.   ADDITIONAL GENETIC TESTING:  We discussed with Roy Harris that his genetic testing was fairly extensive.  If there are genes identified to increase cancer risk that can be analyzed in the future, we would be happy to discuss and coordinate this testing at that time.    CANCER SCREENING RECOMMENDATIONS:  Roy Harris's test result is considered negative (normal).  This means that we have not identified a hereditary cause for his personal history of colon polyps at this time.  An individual's cancer risk and medical management are not determined by genetic test results alone. Overall cancer risk assessment incorporates additional factors, including personal medical history, family history, and any available genetic information that may result in a personalized plan for cancer prevention and surveillance. Therefore, it is recommended he continue to follow the cancer management and screening guidelines provided by his  healthcare providers.  RECOMMENDATIONS FOR FAMILY MEMBERS:   Since he did not inherit a mutation in a polyposis/cancer predisposition gene included on this panel, his children could not have inherited a  mutation from him in one of these genes.  FOLLOW-UP:  Cancer genetics is a rapidly advancing field and it is possible that new genetic tests will be appropriate for him and/or his family members in the future. We encouraged him to remain in contact with cancer genetics on an annual basis so we can update his personal and family histories and let him know of advances in cancer genetics that may benefit this family.   Our contact number was provided. Roy Harris's questions were answered to his satisfaction, and he knows he is welcome to call us at anytime with additional questions or concerns.   Lalla Brothers, MS, [facility] Genetic Counselor [email or web address] (P) [phone]

## 2023-04-26 ENCOUNTER — Encounter: Payer: Self-pay | Admitting: Nurse Practitioner

## 2023-04-26 ENCOUNTER — Ambulatory Visit (INDEPENDENT_AMBULATORY_CARE_PROVIDER_SITE_OTHER): Payer: Medicare HMO | Admitting: Nurse Practitioner

## 2023-04-26 VITALS — BP 112/70 | HR 68 | Temp 97.6°F | Ht 67.0 in | Wt 264.6 lb

## 2023-04-26 DIAGNOSIS — I5030 Unspecified diastolic (congestive) heart failure: Secondary | ICD-10-CM | POA: Diagnosis not present

## 2023-04-26 DIAGNOSIS — E782 Mixed hyperlipidemia: Secondary | ICD-10-CM | POA: Diagnosis not present

## 2023-04-26 DIAGNOSIS — Z79899 Other long term (current) drug therapy: Secondary | ICD-10-CM | POA: Diagnosis not present

## 2023-04-26 DIAGNOSIS — E119 Type 2 diabetes mellitus without complications: Secondary | ICD-10-CM

## 2023-04-26 DIAGNOSIS — G4733 Obstructive sleep apnea (adult) (pediatric): Secondary | ICD-10-CM | POA: Diagnosis not present

## 2023-04-26 DIAGNOSIS — D126 Benign neoplasm of colon, unspecified: Secondary | ICD-10-CM

## 2023-04-26 LAB — CBC WITH DIFFERENTIAL/PLATELET
Absolute Monocytes: 578 cells/uL (ref 200–950)
Basophils Relative: 0.5 %
Eosinophils Relative: 1.1 %
MCH: 29.6 pg (ref 27.0–33.0)
MCHC: 33 g/dL (ref 32.0–36.0)
MPV: 11.3 fL (ref 7.5–12.5)
Monocytes Relative: 7.7 %
Neutro Abs: 5070 cells/uL (ref 1500–7800)
RBC: 4.96 10*6/uL (ref 4.20–5.80)
RDW: 12 % (ref 11.0–15.0)
WBC: 7.5 10*3/uL (ref 3.8–10.8)
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
## 2023-04-26 NOTE — Patient Instructions (Signed)

## 2023-04-27 LAB — LIPID PANEL
Cholesterol: 132 mg/dL (ref <200)
HDL: 31 mg/dL — ABNORMAL LOW (ref >=40)
LDL Cholesterol (Calc): 70 mg/dL (calc)
Non-HDL Cholesterol (Calc): 101 mg/dL (calc) (ref <130)
Total CHOL/HDL Ratio: 4.3 (calc) (ref <5.0)
Triglycerides: 226 mg/dL — ABNORMAL HIGH (ref <150)

## 2023-04-27 LAB — COMPLETE METABOLIC PANEL WITH GFR
AG Ratio: 1.5 (calc) (ref 1.0–2.5)
ALT: 30 U/L (ref 9–46)
AST: 22 U/L (ref 10–35)
Albumin: 4.2 g/dL (ref 3.6–5.1)
Alkaline phosphatase (APISO): 76 U/L (ref 35–144)
BUN: 20 mg/dL (ref 7–25)
CO2: 27 mmol/L (ref 20–32)
Calcium: 10.2 mg/dL (ref 8.6–10.3)
Chloride: 105 mmol/L (ref 98–110)
Creat: 0.97 mg/dL (ref 0.70–1.28)
Globulin: 2.8 g/dL (calc) (ref 1.9–3.7)
Glucose, Bld: 98 mg/dL (ref 65–99)
Potassium: 4.6 mmol/L (ref 3.5–5.3)
Sodium: 140 mmol/L (ref 135–146)
Total Bilirubin: 1.5 mg/dL — ABNORMAL HIGH (ref 0.2–1.2)
Total Protein: 7 g/dL (ref 6.1–8.1)
eGFR: 82 mL/min/{1.73_m2} (ref >=60)

## 2023-04-27 LAB — CBC WITH DIFFERENTIAL/PLATELET
Basophils Absolute: 38 cells/uL (ref 0–200)
Eosinophils Absolute: 83 cells/uL (ref 15–500)
HCT: 44.6 % (ref 38.5–50.0)
Hemoglobin: 14.7 g/dL (ref 13.2–17.1)
Lymphs Abs: 1733 cells/uL (ref 850–3900)
MCV: 89.9 fL (ref 80.0–100.0)
Neutrophils Relative %: 67.6 %
Platelets: 209 10*3/uL (ref 140–400)
Total Lymphocyte: 23.1 %

## 2023-04-27 LAB — HEMOGLOBIN A1C
Hgb A1c MFr Bld: 6.8 % of total Hgb — ABNORMAL HIGH (ref <5.7)
Mean Plasma Glucose: 148 mg/dL
eAG (mmol/L): 8.2 mmol/L

## 2023-06-08 ENCOUNTER — Encounter (INDEPENDENT_AMBULATORY_CARE_PROVIDER_SITE_OTHER): Payer: Self-pay

## 2023-06-08 ENCOUNTER — Other Ambulatory Visit: Payer: Self-pay | Admitting: Family Medicine

## 2023-07-04 ENCOUNTER — Encounter: Payer: Self-pay | Admitting: Cardiovascular Disease

## 2023-07-04 ENCOUNTER — Ambulatory Visit: Payer: Medicare HMO | Attending: Cardiovascular Disease | Admitting: Cardiovascular Disease

## 2023-07-04 VITALS — BP 145/85 | HR 59 | Ht 66.0 in | Wt 269.6 lb

## 2023-07-04 DIAGNOSIS — I10 Essential (primary) hypertension: Secondary | ICD-10-CM | POA: Diagnosis not present

## 2023-07-04 DIAGNOSIS — Z79899 Other long term (current) drug therapy: Secondary | ICD-10-CM | POA: Diagnosis not present

## 2023-07-04 DIAGNOSIS — I5030 Unspecified diastolic (congestive) heart failure: Secondary | ICD-10-CM

## 2023-07-04 MED ORDER — HYDROCHLOROTHIAZIDE 25 MG PO TABS
25.0000 mg | ORAL_TABLET | Freq: Every day | ORAL | 2 refills | Status: DC
Start: 2023-07-04 — End: 2023-10-11

## 2023-07-04 MED ORDER — FUROSEMIDE 40 MG PO TABS
40.0000 mg | ORAL_TABLET | Freq: Every day | ORAL | 2 refills | Status: DC | PRN
Start: 2023-07-04 — End: 2023-07-06

## 2023-07-04 MED ORDER — POTASSIUM CHLORIDE CRYS ER 20 MEQ PO TBCR
20.0000 meq | EXTENDED_RELEASE_TABLET | Freq: Every day | ORAL | 1 refills | Status: DC
Start: 2023-07-04 — End: 2023-07-06

## 2023-07-04 MED ORDER — VALSARTAN 80 MG PO TABS
80.0000 mg | ORAL_TABLET | Freq: Every day | ORAL | 1 refills | Status: DC
Start: 2023-07-04 — End: 2023-07-06

## 2023-07-04 NOTE — Patient Instructions (Signed)
Medication Instructions:   DECREASE YOUR LASIX TO TAKING 40 MG BY MOUTH DAILY ONLY AS NEEDED FOR LOWER EXTREMITY SWELLING  START TAKING VALSARTAN 80 MG BY MOUTH DAILY  START TAKING HYDROCHLOROTHIAZIDE 25 MG BY MOUTH DAILY--TAKE YOUR POTASSIUM PILL EVERYDAY WITH THIS MEDICATION  START TAKING POTASSIUM CHLORIDE 20 mEq BY MOUTH DAILY WITH YOUR HYDROCHLOROTHIAZIDE    *If you need a refill on your cardiac medications before your next appointment, please call your pharmacy*   Lab Work:  IN 3 WEEKS HERE IN THE OFFICE--BMET  If you have labs (blood work) drawn today and your tests are completely normal, you will receive your results only by: MyChart Message (if you have MyChart) OR A paper copy in the mail If you have any lab test that is abnormal or we need to change your treatment, we will call you to review the results.    Follow-Up:  3 MONTHS WITH DR. Elease Hashimoto IN THE OFFICE

## 2023-07-04 NOTE — Progress Notes (Signed)
Cardiology Office Note:  .   Date:  07/04/2023  ID:  Roy Harris, DOB 1951/01/25, MRN [id number] PCP: Adela Glimpse, NP  [facility] Providers Cardiologist:  Deaisa Merida    History of Present Illness: .   Roy Harris is a 72 y.o. male with chronic diastolic dysfunction (grade I) , HTN, OSA , morbid obesity , hyperlipidemia   Seen with interpreter, Zachery Dakins    Echo from 2020 shows normal LV function .  Grade I DD .  No CP , Has some DOE,  Has OSA, uses his CPAP  Takes lasix 40-80  mg 1-2 times a week .  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        ROS:   Studies Reviewed: Marland Kitchen    EKG Interpretation Date/Time:  Monday July 04 2023 08:58:47 EDT Ventricular Rate:  59 PR Interval:  140 QRS Duration:  104 QT Interval:  422 QTC Calculation: 417 R Axis:   28  Text Interpretation: Sinus bradycardia Nonspecific T wave abnormality No previous ECGs available Confirmed by Kristeen Miss [phone]) on 07/04/2023 5:25:05 PM  EKG Interpretation Date/Time:  Monday July 04 2023 08:58:47 EDT Ventricular Rate:  59 PR Interval:  140 QRS Duration:  104 QT Interval:  422 QTC Calculation: 417 R Axis:   28  Text Interpretation: Sinus bradycardia Nonspecific T wave abnormality No previous ECGs available Confirmed by Kristeen Miss ([id number]) on 07/04/2023 5:25:05 PM    Risk Assessment/Calculations:     HYPERTENSION CONTROL Vitals:   07/04/23 0855 07/04/23 0913  BP: (!) 144/82 (!) 145/85    The patient's blood pressure is elevated above target today.  In order to address the patient's elevated BP: A new medication was prescribed today.          Physical Exam:   VS:  BP (!) 145/85   Pulse (!) 59   Ht 5\' 6"  (1.676 m)   Wt 269 lb 9.6 oz (122.3 kg)   SpO2 95%   BMI 43.51 kg/m    Wt Readings from Last 3  Encounters:  07/04/23 269 lb 9.6 oz (122.3 kg)  04/26/23 264 lb 9.6 oz (120 kg)  01/20/23 262 lb (118.8 kg)    GEN: Well nourished, well developed in no acute distress NECK: No JVD; No carotid bruits CARDIAC: RRR, no murmurs, rubs, gallops RESPIRATORY:  Clear to auscultation without rales, wheezing or rhonchi  ABDOMEN: Soft, non-tender, non-distended EXTREMITIES:  1+ edema; No deformity   ASSESSMENT AND PLAN: .    Chronic diastolic CHF:   he eats a very salty diet, does not exercise, is morbidly obese  Have discussed the need for getting regular exercise, cutting back on his salt intake, cutting back on his carbohydrates.  He only takes the Lasix as needed because he cannot get anything done on the days he takes his Lasix.  Will make the Lasix only as needed and we will add HCTZ 25 mg a day.  He will continue potassium chloride 20 mill equivalents a day.  Will add valsartan 80 mg a day for his hypertension.   2.  Hypertension: Blood pressure remains mildly elevated.  He eats a very high salt diet.  Will add valsartan and HCTZ.  3.  Diabetes mellitus: Valsartan should help with minimizing risk of diabetic kidney disease.  Dispo:  Signed, Kristeen Miss, MD

## 2023-07-06 ENCOUNTER — Encounter: Payer: Medicare HMO | Admitting: Family Medicine

## 2023-07-06 ENCOUNTER — Telehealth: Payer: Self-pay | Admitting: Cardiovascular Disease

## 2023-07-06 DIAGNOSIS — Z79899 Other long term (current) drug therapy: Secondary | ICD-10-CM

## 2023-07-06 DIAGNOSIS — I5030 Unspecified diastolic (congestive) heart failure: Secondary | ICD-10-CM

## 2023-07-06 DIAGNOSIS — I10 Essential (primary) hypertension: Secondary | ICD-10-CM

## 2023-07-06 MED ORDER — FUROSEMIDE 40 MG PO TABS: 40.0000 mg | ORAL_TABLET | Freq: Every day | ORAL | 3 refills | Status: DC | PRN

## 2023-07-06 MED ORDER — POTASSIUM CHLORIDE CRYS ER 20 MEQ PO TBCR
20.0000 meq | EXTENDED_RELEASE_TABLET | Freq: Every day | ORAL | 3 refills | Status: DC
Start: 2023-07-06 — End: 2023-11-30

## 2023-07-06 MED ORDER — VALSARTAN 80 MG PO TABS
80.0000 mg | ORAL_TABLET | Freq: Every day | ORAL | 3 refills | Status: DC
Start: 2023-07-06 — End: 2024-08-07

## 2023-07-06 NOTE — Telephone Encounter (Signed)
*  STAT* If patient is at the pharmacy, call can be transferred to refill team.   1. Which medications need to be refilled? (please list name of each medication and dose if known) furosemide (LASIX) 40 MG tablet   potassium chloride SA (KLOR-CON M) 20 MEQ tablet   valsartan (DIOVAN) 80 MG tablet   2. Which pharmacy/location (including street and city if local pharmacy) is medication to be sent to? [id number] - [place], [place] - [address]   3. Do they need a 30 day or 90 day supply? 90

## 2023-07-06 NOTE — Telephone Encounter (Signed)
Pt's medications were sent to pt's pharmacy as requested. Confirmation received.  

## 2023-07-19 ENCOUNTER — Other Ambulatory Visit: Payer: Self-pay | Admitting: Family Medicine

## 2023-07-19 DIAGNOSIS — E118 Type 2 diabetes mellitus with unspecified complications: Secondary | ICD-10-CM

## 2023-07-26 ENCOUNTER — Ambulatory Visit: Payer: Medicare HMO | Attending: Cardiovascular Disease

## 2023-07-26 DIAGNOSIS — I5030 Unspecified diastolic (congestive) heart failure: Secondary | ICD-10-CM | POA: Diagnosis not present

## 2023-07-26 DIAGNOSIS — Z79899 Other long term (current) drug therapy: Secondary | ICD-10-CM | POA: Diagnosis not present

## 2023-07-26 DIAGNOSIS — I10 Essential (primary) hypertension: Secondary | ICD-10-CM | POA: Diagnosis not present

## 2023-07-28 ENCOUNTER — Ambulatory Visit: Payer: Medicare HMO | Admitting: Internal Medicine

## 2023-08-02 ENCOUNTER — Encounter: Payer: Self-pay | Admitting: Internal Medicine

## 2023-08-02 ENCOUNTER — Ambulatory Visit (INDEPENDENT_AMBULATORY_CARE_PROVIDER_SITE_OTHER): Payer: Medicare HMO | Admitting: Internal Medicine

## 2023-08-02 VITALS — BP 110/60 | HR 93 | Temp 97.9°F | Resp 17 | Ht 66.0 in | Wt 263.8 lb

## 2023-08-02 DIAGNOSIS — E1169 Type 2 diabetes mellitus with other specified complication: Secondary | ICD-10-CM

## 2023-08-02 DIAGNOSIS — G4733 Obstructive sleep apnea (adult) (pediatric): Secondary | ICD-10-CM

## 2023-08-02 DIAGNOSIS — E785 Hyperlipidemia, unspecified: Secondary | ICD-10-CM | POA: Diagnosis not present

## 2023-08-02 DIAGNOSIS — N182 Chronic kidney disease, stage 2 (mild): Secondary | ICD-10-CM

## 2023-08-02 DIAGNOSIS — E1122 Type 2 diabetes mellitus with diabetic chronic kidney disease: Secondary | ICD-10-CM | POA: Insufficient documentation

## 2023-08-02 DIAGNOSIS — Z79899 Other long term (current) drug therapy: Secondary | ICD-10-CM | POA: Diagnosis not present

## 2023-08-02 DIAGNOSIS — E559 Vitamin D deficiency, unspecified: Secondary | ICD-10-CM | POA: Diagnosis not present

## 2023-08-02 DIAGNOSIS — I10 Essential (primary) hypertension: Secondary | ICD-10-CM | POA: Insufficient documentation

## 2023-08-02 MED ORDER — TIRZEPATIDE 2.5 MG/0.5ML ~~LOC~~ SOAJ
SUBCUTANEOUS | 0 refills | Status: DC
Start: 2023-08-02 — End: 2023-08-27
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
## 2023-08-02 NOTE — Patient Instructions (Signed)

## 2023-08-03 LAB — CBC WITH DIFFERENTIAL/PLATELET
Absolute Monocytes: 413 cells/uL (ref 200–950)
Basophils Absolute: 16 cells/uL (ref 0–200)
Basophils Relative: 0.2 %
Eosinophils Absolute: 57 cells/uL (ref 15–500)
Eosinophils Relative: 0.7 %
HCT: 45.6 % (ref 38.5–50.0)
Hemoglobin: 14.7 g/dL (ref 13.2–17.1)
Lymphs Abs: 1450 cells/uL (ref 850–3900)
MCH: 29.1 pg (ref 27.0–33.0)
MCHC: 32.2 g/dL (ref 32.0–36.0)
MCV: 90.3 fL (ref 80.0–100.0)
MPV: 11.6 fL (ref 7.5–12.5)
Monocytes Relative: 5.1 %
Neutro Abs: 6164 cells/uL (ref 1500–7800)
Neutrophils Relative %: 76.1 %
Platelets: 203 10*3/uL (ref 140–400)
RBC: 5.05 10*6/uL (ref 4.20–5.80)
RDW: 11.8 % (ref 11.0–15.0)
Total Lymphocyte: 17.9 %
WBC: 8.1 10*3/uL (ref 3.8–10.8)

## 2023-08-03 LAB — COMPLETE METABOLIC PANEL WITH GFR
AG Ratio: 1.6 (calc) (ref 1.0–2.5)
ALT: 33 U/L (ref 9–46)
AST: 24 U/L (ref 10–35)
Albumin: 4.2 g/dL (ref 3.6–5.1)
Alkaline phosphatase (APISO): 70 U/L (ref 35–144)
BUN/Creatinine Ratio: 22 (calc) (ref 6–22)
BUN: 26 mg/dL — ABNORMAL HIGH (ref 7–25)
CO2: 25 mmol/L (ref 20–32)
Calcium: 10.4 mg/dL — ABNORMAL HIGH (ref 8.6–10.3)
Chloride: 104 mmol/L (ref 98–110)
Creat: 1.17 mg/dL (ref 0.70–1.28)
Globulin: 2.6 g/dL (calc) (ref 1.9–3.7)
Glucose, Bld: 208 mg/dL — ABNORMAL HIGH (ref 65–99)
Potassium: 4.1 mmol/L (ref 3.5–5.3)
Sodium: 139 mmol/L (ref 135–146)
Total Bilirubin: 1.7 mg/dL — ABNORMAL HIGH (ref 0.2–1.2)
Total Protein: 6.8 g/dL (ref 6.1–8.1)
eGFR: 66 mL/min/{1.73_m2} (ref >=60)

## 2023-08-03 LAB — HEMOGLOBIN A1C
Hgb A1c MFr Bld: 7 % of total Hgb — ABNORMAL HIGH (ref <5.7)
Mean Plasma Glucose: 154 mg/dL
eAG (mmol/L): 8.5 mmol/L

## 2023-08-03 LAB — INSULIN, RANDOM: Insulin: 328.6 u[IU]/mL — ABNORMAL HIGH

## 2023-08-03 LAB — LIPID PANEL
Cholesterol: 148 mg/dL (ref <200)
HDL: 30 mg/dL — ABNORMAL LOW (ref >=40)
LDL Cholesterol (Calc): 91 mg/dL (calc)
Non-HDL Cholesterol (Calc): 118 mg/dL (calc) (ref <130)
Total CHOL/HDL Ratio: 4.9 (calc) (ref <5.0)
Triglycerides: 178 mg/dL — ABNORMAL HIGH (ref <150)

## 2023-08-03 LAB — MAGNESIUM: Magnesium: 2.1 mg/dL (ref 1.5–2.5)

## 2023-08-03 LAB — VITAMIN D 25 HYDROXY (VIT D DEFICIENCY, FRACTURES): Vit D, 25-Hydroxy: 25 ng/mL — ABNORMAL LOW (ref 30–100)

## 2023-08-03 LAB — TSH: TSH: 1.19 mIU/L (ref 0.40–4.50)

## 2023-08-03 NOTE — Progress Notes (Signed)
<>*<>*<>*<>*<>*<>*<>*<>*<>*<>*<>*<>*<>*<>*<>*<>*<>*<>*<>*<>*<>*<>*<>*<>*<> <>*<>*<>*<>*<>*<>*<>*<>*<>*<>*<>*<>*<>*<>*<>*<>*<>*<>*<>*<>*<>*<>*<>*<>*<>  -Test results slightly outside the reference range are not unusual. If there is anything important, I will review this with you,  otherwise it is considered normal test values.  If you have further questions,  please do not hesitate to contact me at the office or via My Chart.   <>*<>*<>*<>*<>*<>*<>*<>*<>*<>*<>*<>*<>*<>*<>*<>*<>*<>*<>*<>*<>*<>*<>*<>*<> <>*<>*<>*<>*<>*<>*<>*<>*<>*<>*<>*<>*<>*<>*<>*<>*<>*<>*<>*<>*<>*<>*<>*<>*<>  -  Random glucose = 208 mg% - is too high ( goal is less than 120 mg% )   -  Also, A1c = 7.0% also too high  ( goal is less than 5.7% )    -  Being diabetic has a  300% increased risk for heart attack,                                                 stroke, cancer, and alzheimer- type vascular dementia.   -  It is very important that you work harder with diet by                                   avoiding all foods that are white except chicken, fish & calliflower.  - Avoid white rice  (brown & wild rice is OK),   - Avoid white potatoes  (sweet potatoes in moderation is OK),   White bread or wheat bread or anything made out of                                                    white flour like bagels, donuts, rolls, buns, biscuits, cakes,  - pastries, cookies, pizza crust, and pasta (made from white flour & egg whites)   - vegetarian pasta or spinach or wheat pasta is OK.  - Multigrain breads like Arnold's, Pepperidge Farm or                                                   multigrain sandwich thins or high fiber breads like   Eureka bread or "Dave's Killer" breads that are 4 to 5 grams fiber per slice !  are best.    Diet, exercise and weight loss can reverse and cure diabetes in the early stages.    - Diet, exercise and weight loss is very important in the                                            control and prevention of complications of diabetes which   affects every system in your body, ie.   -Brain - dementia/stroke,  - eyes - glaucoma/blindness,  - heart - heart attack/heart failure,  - kidneys - dialysis,  - stomach - gastric paralysis,  - intestines - malabsorption,  - nerves - severe painful neuritis,  - circulation - gangrene & loss of a leg(s)  - and finally  . . . . . . . . . . . . . . . . . Marland Kitchen    -  cancer and Alzheimers.  <>*<>*<>*<>*<>*<>*<>*<>*<>*<>*<>*<>*<>*<>*<>*<>*<>*<>*<>*<>*<>*<>*<>*<>*<> <>*<>*<>*<>*<>*<>*<>*<>*<>*<>*<>*<>*<>*<>*<>*<>*<>*<>*<>*<>*<>*<>*<>*<>*<> <>*<>*<>*<>*<>*<>*<>*<>*<>*<>*<>*<>*<>*<>*<>*<>*<>*<>*<>*<>*<>*<>*<>*<>*<> <>*<>*<>*<>*<>*<>*<>*<>*<>*<>*<>*<>*<>*<>*<>*<>*<>*<>*<>*<>*<>*<>*<>*<>*<>   - HOPEFULLY,  starting the Mounjaro shots will                                                  help bring down sugars & also help with Weight Loss !   <>*<>*<>*<>*<>*<>*<>*<>*<>*<>*<>*<>*<>*<>*<>*<>*<>*<>*<>*<>*<>*<>*<>*<>*<> <>*<>*<>*<>*<>*<>*<>*<>*<>*<>*<>*<>*<>*<>*<>*<>*<>*<>*<>*<>*<>*<>*<>*<>*<> <>*<>*<>*<>*<>*<>*<>*<>*<>*<>*<>*<>*<>*<>*<>*<>*<>*<>*<>*<>*<>*<>*<>*<>*<> <>*<>*<>*<>*<>*<>*<>*<>*<>*<>*<>*<>*<>*<>*<>*<>*<>*<>*<>*<>*<>*<>*<>*<>*<>  -  Vitamin D = 25 is      EXTREMELY    &     Dangerously LOW  -  Vitamin D goal is between 70-100.   - Please take  Vitamin D 5,000 unit capsules  x 2 capsules = 10,000 units EVERY day  !   - It is very important as a natural anti-inflammatory and helping the                           immune system protect against viral infections, like the Covid-19    -  helping hair, skin, and nails, as well as reducing stroke and heart attack risk.   - It helps your bones and helps with mood.  - It also decreases numerous cancer risks so please                                                                                           take it as directed.   - Low Vit D is associated with a 200-300% higher  risk for CANCER                                        and 200-300% higher risk for HEART   ATTACK  &  STROKE.    - It is also associated with higher death rate at younger ages,   autoimmune diseases like Rheumatoid arthritis, Lupus, Multiple Sclerosis.     - Also many other serious conditions, like depression, Alzheimer's Dementia,                                              muscle aches, fatigue, fibromyalgia   <>*<>*<>*<>*<>*<>*<>*<>*<>*<>*<>*<>*<>*<>*<>*<>*<>*<>*<>*<>*<>*<>*<>*<>*<> <>*<>*<>*<>*<>*<>*<>*<>*<>*<>*<>*<>*<>*<>*<>*<>*<>*<>*<>*<>*<>*<>*<>*<>*<>  -  All Else - CBC - Kidneys - Electrolytes - Liver - Magnesium & Thyroid    - all  Normal / OK  <>*<>*<>*<>*<>*<>*<>*<>*<>*<>*<>*<>*<>*<>*<>*<>*<>*<>*<>*<>*<>*<>*<>*<>*<> <>*<>*<>*<>*<>*<>*<>*<>*<>*<>*<>*<>*<>*<>*<>*<>*<>*<>*<>*<>*<>*<>*<>*<>*<>

## 2023-08-26 ENCOUNTER — Other Ambulatory Visit: Payer: Self-pay | Admitting: Internal Medicine

## 2023-08-26 DIAGNOSIS — E1122 Type 2 diabetes mellitus with diabetic chronic kidney disease: Secondary | ICD-10-CM

## 2023-08-27 ENCOUNTER — Other Ambulatory Visit: Payer: Self-pay | Admitting: Internal Medicine

## 2023-08-27 DIAGNOSIS — E1122 Type 2 diabetes mellitus with diabetic chronic kidney disease: Secondary | ICD-10-CM

## 2023-08-27 MED ORDER — TIRZEPATIDE 5 MG/0.5ML ~~LOC~~ SOAJ
SUBCUTANEOUS | 0 refills | Status: DC
Start: 2023-08-27 — End: 2023-09-27

## 2023-09-27 ENCOUNTER — Encounter: Payer: Self-pay | Admitting: Nurse Practitioner

## 2023-09-27 ENCOUNTER — Telehealth: Payer: Self-pay | Admitting: Nurse Practitioner

## 2023-09-27 ENCOUNTER — Other Ambulatory Visit: Payer: Self-pay | Admitting: Internal Medicine

## 2023-09-27 DIAGNOSIS — E1122 Type 2 diabetes mellitus with diabetic chronic kidney disease: Secondary | ICD-10-CM

## 2023-09-27 NOTE — Telephone Encounter (Signed)
Patient wants dosage of Mounjaro increased to next MG. He said he has not lost any weight in the past last week. Pharm is  [id number] - [place], [place] - [address]

## 2023-09-28 MED ORDER — TIRZEPATIDE 7.5 MG/0.5ML ~~LOC~~ SOAJ: SUBCUTANEOUS | 0 refills | Status: DC

## 2023-10-01 ENCOUNTER — Encounter: Payer: Self-pay | Admitting: Cardiovascular Disease

## 2023-10-01 NOTE — Progress Notes (Signed)
Pt cancelled  This encounter was created in error - please disregard. 

## 2023-10-04 ENCOUNTER — Ambulatory Visit: Payer: Medicare HMO | Admitting: Cardiovascular Disease

## 2023-10-10 ENCOUNTER — Telehealth: Payer: Self-pay | Admitting: Cardiovascular Disease

## 2023-10-10 ENCOUNTER — Telehealth: Payer: Self-pay | Admitting: Nurse Practitioner

## 2023-10-10 DIAGNOSIS — I5030 Unspecified diastolic (congestive) heart failure: Secondary | ICD-10-CM

## 2023-10-10 DIAGNOSIS — I10 Essential (primary) hypertension: Secondary | ICD-10-CM

## 2023-10-10 DIAGNOSIS — Z79899 Other long term (current) drug therapy: Secondary | ICD-10-CM

## 2023-10-10 MED ORDER — ATORVASTATIN CALCIUM 10 MG PO TABS: ORAL_TABLET | ORAL | 1 refills | Status: DC

## 2023-10-10 MED ORDER — GABAPENTIN 300 MG PO CAPS: 300.0000 mg | ORAL_CAPSULE | Freq: Every day | ORAL | 3 refills | Status: DC

## 2023-10-10 NOTE — Telephone Encounter (Signed)
Sent!

## 2023-10-10 NOTE — Telephone Encounter (Signed)
*  STAT* If patient is at the pharmacy, call can be transferred to refill team.   1. Which medications need to be refilled? (please list name of each medication and dose if known)  hydrochlorothiazide (HYDRODIURIL) 25 MG tablet   valsartan (DIOVAN) 80 MG tablet   2. Which pharmacy/location (including street and city if local pharmacy) is medication to be sent to? [id number] - [place], [place] - [address] Phone: [phone]  Fax: [phone]     3. Do they need a 30 day or 90 day supply? 90

## 2023-10-11 ENCOUNTER — Telehealth: Payer: Self-pay | Admitting: Cardiovascular Disease

## 2023-10-11 DIAGNOSIS — Z79899 Other long term (current) drug therapy: Secondary | ICD-10-CM

## 2023-10-11 DIAGNOSIS — I10 Essential (primary) hypertension: Secondary | ICD-10-CM

## 2023-10-11 DIAGNOSIS — I5030 Unspecified diastolic (congestive) heart failure: Secondary | ICD-10-CM

## 2023-10-11 MED ORDER — HYDROCHLOROTHIAZIDE 25 MG PO TABS
25.0000 mg | ORAL_TABLET | Freq: Every day | ORAL | 2 refills | Status: DC
Start: 2023-10-11 — End: 2023-11-30

## 2023-10-11 NOTE — Telephone Encounter (Signed)
 RX sent to requested Pharmacy

## 2023-10-11 NOTE — Telephone Encounter (Signed)
*  STAT* If patient is at the pharmacy, call can be transferred to refill team.   1. Which medications need to be refilled? (please list name of each medication and dose if known)  hydrochlorothiazide (HYDRODIURIL) 25 MG tablet    2. Would you like to learn more about the convenience, safety, & potential cost savings by using the [facility]?      3. Are you open to using the [facility] (Type [facility].  ).   4. Which pharmacy/location (including street and city if local pharmacy) is medication to be sent to? [id number] - [place],  - [address]    5. Do they need a 30 day or 90 day supply? 90 day    Patient is out of medication

## 2023-10-25 ENCOUNTER — Telehealth: Payer: Self-pay | Admitting: Nurse Practitioner

## 2023-10-25 DIAGNOSIS — E1122 Type 2 diabetes mellitus with diabetic chronic kidney disease: Secondary | ICD-10-CM

## 2023-10-25 MED ORDER — TIRZEPATIDE 7.5 MG/0.5ML ~~LOC~~ SOAJ: SUBCUTANEOUS | 0 refills | Status: DC

## 2023-10-25 NOTE — Telephone Encounter (Signed)
Refill on Mounjaro. This is working for him. [facility] [phone] - [place],  - [address]  [address], [place] [address]

## 2023-10-25 NOTE — Addendum Note (Signed)
Addended by: Dionicio Stall on: 10/25/2023 03:51 PM   Modules accepted: Orders

## 2023-11-21 ENCOUNTER — Telehealth: Payer: Self-pay | Admitting: Nurse Practitioner

## 2023-11-21 ENCOUNTER — Encounter: Payer: Self-pay | Admitting: Nurse Practitioner

## 2023-11-21 MED ORDER — TIRZEPATIDE 10 MG/0.5ML ~~LOC~~ SOAJ: SUBCUTANEOUS | 0 refills | Status: DC

## 2023-11-21 NOTE — Telephone Encounter (Signed)
 Patient states that he needs a refill on the [facility]. He says he isn't losing weight anymore and would like to move up to the 10 mg. Please send to [facility] in [place].

## 2023-11-23 ENCOUNTER — Ambulatory Visit: Payer: Medicare HMO | Admitting: Nurse Practitioner

## 2023-11-28 NOTE — Progress Notes (Unsigned)
Cardiology Office Note    Date:  11/29/2023  ID:  Roy Harris, DOB 1951-09-26, MRN [id number] PCP:  Adela Glimpse, NP  Cardiologist:  Dr. Kristeen Miss Electrophysiologist:  None   Chief Complaint: f/u CHF  History of Present Illness: .    Roy Harris is a 73 y.o. male with visit-pertinent history of chronic HFpEF, HTN, mild dilation of aortic root, OSA by sleep study 2022, morbid obesity, HLD, DM seen for 3 month follow-up. He established care with Dr. Elease Harris in 06/2023 for chronic diastolic HF. Lasix was changed to PRN only due to frequent urination and he was started on hydrochlorothiazide + valsartan. Prior echo 2020 EF 60-65%, impaired relaxation, normal RV, mild dilation of aortic root.  He is seen for follow-up today doing well. The interpreter is here with [place] to help fill in the gaps but he also understands English very well. He reports he is doing great without any CP, SOB, new edema, orthopnea, syncope or complaints. He reports he had trouble getting in touch with the pulm team that managed his OSA in the past so he put his machine away a year ago and hasn't used it since.   Labwork independently reviewed: 07/2023 TSH wnl, trig 178, LDL 91, Mg 2.1, Cr 1.17, ca 10.4, T Bili up similarly before (PCP), AST ALT OK, CBC OK -> PCP  ROS: .    Please see the history of present illness. All other systems are reviewed and otherwise negative.  Studies Reviewed: Marland Kitchen    EKG:  EKG is ordered today, personally reviewed, demonstrating NSR 69, baseline artifact present V1-V3 but otherwise no acute STT changes  CV Studies: Cardiac studies reviewed are outlined and summarized above. Otherwise please see EMR for full report.   Current Reported Medications:.    Current Meds  Medication Sig   aspirin EC 81 MG tablet Take 1 tablet (81 mg total) by mouth daily.   atorvastatin (LIPITOR) 10 MG tablet TAKE 1 TABLET(10 MG) BY MOUTH EVERY NIGHT AT BEDTIME   fluticasone (FLONASE) 50  MCG/ACT nasal spray Place 1 spray into both nostrils daily. (Patient taking differently: Place 1 spray into both nostrils as needed for allergies.)   gabapentin (NEURONTIN) 300 MG capsule Take 1 capsule (300 mg total) by mouth at bedtime.   hydrochlorothiazide (HYDRODIURIL) 25 MG tablet Take 1 tablet (25 mg total) by mouth daily.   metFORMIN (GLUCOPHAGE) 500 MG tablet TAKE 1 TABLET(500 MG) BY MOUTH IN THE MORNING AND AT BEDTIME   potassium chloride SA (KLOR-CON M) 20 MEQ tablet Take 1 tablet (20 mEq total) by mouth daily. Take with hydrochlorothiazide.   tirzepatide (MOUNJARO) 10 MG/0.5ML Pen Inject 1 pen ( 10 mg ) into Skin every 7 days   ( Dx:  e11.29)   valsartan (DIOVAN) 80 MG tablet Take 1 tablet (80 mg total) by mouth daily.    Physical Exam:    VS:  BP 126/68   Pulse 69   Ht 5\' 6"  (1.676 m)   Wt 222 lb 6.4 oz (100.9 kg)   SpO2 96%   BMI 35.90 kg/m    Wt Readings from Last 3 Encounters:  11/29/23 222 lb 6.4 oz (100.9 kg)  08/02/23 263 lb 12.8 oz (119.7 kg)  07/04/23 269 lb 9.6 oz (122.3 kg)    GEN: Well nourished, well developed in no acute distress NECK: No JVD; No carotid bruits CARDIAC: RRR, no murmurs, rubs, gallops RESPIRATORY:  Clear to auscultation without rales, wheezing or rhonchi  ABDOMEN:  Soft, non-tender, non-distended EXTREMITIES:  No edema; No acute deformity   Asessement and Plan:.    1. Chronic HFpEF, HTN - appears euvolemic on exam. He is due for repeat echo for his dilated aortic root regardless, will arrange. Continue present regimen. Check BMET today given mild hypercalcemia noted on 07/2023 labs. No change in regimen otherwise at this time. We did discuss the importance of OSA management with regard to heart health, see #4.  2. Mild dilation of aortic root - due for repeat echo, will arrange.  3. Hyperlipidemia - this has been managed by primary care, will defer further monitoring/management to their team.  4. OSA - overdue for OSA follow-up, last sleep  study/eval was in 2022 with [facility]. Patient reports difficulty trying to schedule with their office and would prefer to establish with [place] to follow this. Will refer to Dr. Mayford Knife for management.     Disposition: F/u with Dr. Mayford Knife for management of OSA.  I think she can also be his primary cardiologist going forward after Dr. Harvie Bridge retirement in June.  Signed, Laurann Montana, PA-C

## 2023-11-29 ENCOUNTER — Telehealth: Payer: Self-pay | Admitting: *Deleted

## 2023-11-29 ENCOUNTER — Ambulatory Visit: Payer: Medicare HMO | Attending: Physician Assistant | Admitting: Physician Assistant

## 2023-11-29 ENCOUNTER — Other Ambulatory Visit: Payer: Self-pay | Admitting: Physician Assistant

## 2023-11-29 ENCOUNTER — Encounter: Payer: Self-pay | Admitting: Physician Assistant

## 2023-11-29 VITALS — BP 126/68 | HR 69 | Ht 66.0 in | Wt 222.4 lb

## 2023-11-29 DIAGNOSIS — E782 Mixed hyperlipidemia: Secondary | ICD-10-CM

## 2023-11-29 DIAGNOSIS — I10 Essential (primary) hypertension: Secondary | ICD-10-CM

## 2023-11-29 DIAGNOSIS — I77810 Thoracic aortic ectasia: Secondary | ICD-10-CM

## 2023-11-29 DIAGNOSIS — I5032 Chronic diastolic (congestive) heart failure: Secondary | ICD-10-CM

## 2023-11-29 LAB — BASIC METABOLIC PANEL
BUN/Creatinine Ratio: 28 — ABNORMAL HIGH (ref 10–24)
BUN: 37 mg/dL — ABNORMAL HIGH (ref 8–27)
CO2: 24 mmol/L (ref 20–29)
Calcium: 11.2 mg/dL — ABNORMAL HIGH (ref 8.6–10.2)
Chloride: 102 mmol/L (ref 96–106)
Creatinine, Ser: 1.34 mg/dL — ABNORMAL HIGH (ref 0.76–1.27)
Glucose: 94 mg/dL (ref 70–99)
Potassium: 5.2 mmol/L (ref 3.5–5.2)
Sodium: 139 mmol/L (ref 134–144)
eGFR: 56 mL/min/{1.73_m2} — ABNORMAL LOW (ref >59)

## 2023-11-29 NOTE — Patient Instructions (Signed)
Medication Instructions:   Your physician recommends that you continue on your current medications as directed. Please refer to the Current Medication list given to you today.   *If you need a refill on your cardiac medications before your next appointment, please call your pharmacy*   Lab Work:  TODAY!!! BMET  If you have labs (blood work) drawn today and your tests are completely normal, you will receive your results only by: MyChart Message (if you have MyChart) OR A paper copy in the mail If you have any lab test that is abnormal or we need to change your treatment, we will call you to review the results.   Testing/Procedures:  Your physician has requested that you have an echocardiogram. Echocardiography is a painless test that uses sound waves to create images of your heart. It provides your doctor with information about the size and shape of your heart and how well your heart's chambers and valves are working. This procedure takes approximately one hour. There are no restrictions for this procedure. Please do NOT wear cologne, aftershave, or lotions (deodorant is allowed). Please arrive 15 minutes prior to your appointment time.  Please note: We ask at that you not bring children with you during ultrasound (echo/ vascular) testing. Due to room size and safety concerns, children are not allowed in the ultrasound rooms during exams. Our front office staff cannot provide observation of children in our lobby area while testing is being conducted. An adult accompanying a patient to their appointment will only be allowed in the ultrasound room at the discretion of the ultrasound technician under special circumstances. We apologize for any inconvenience.     Follow-Up: At [facility], you and your health needs are our priority.  As part of our continuing mission to provide you with exceptional heart care, we have created designated Provider Care Teams.  These Care Teams  include your primary Cardiologist (physician) and Advanced Practice Providers (APPs -  Physician Assistants and Nurse Practitioners) who all work together to provide you with the care you need, when you need it.  We recommend signing up for the patient portal called "MyChart".  Sign up information is provided on this After Visit Summary.  MyChart is used to connect with patients for Virtual Visits (Telemedicine).  Patients are able to view lab/test results, encounter notes, upcoming appointments, etc.  Non-urgent messages can be sent to your provider as well.   To learn more about what you can do with MyChart, go to [email or web address].    Your next appointment:   The office will call to schedule appointment.   Provider:   Carolanne Grumbling, MD    Other Instructions    1st Floor: - Lobby - Registration  - Pharmacy  - Lab - Cafe  2nd Floor: - PV Lab - Diagnostic Testing (echo, CT, nuclear med)  3rd Floor: - Vacant  4th Floor: - TCTS (cardiothoracic surgery) - AFib Clinic - Structural Heart Clinic - Vascular Surgery  - Vascular Ultrasound  5th Floor: - HeartCare Cardiology (general and EP) - Clinical Pharmacy for coumadin, hypertension, lipid, weight-loss medications, and med management appointments    Valet parking services will be available as well.

## 2023-11-29 NOTE — Telephone Encounter (Signed)
Needs an appointment with Dr Mayford Knife. He over due.

## 2023-11-30 ENCOUNTER — Telehealth: Payer: Self-pay

## 2023-11-30 DIAGNOSIS — I5032 Chronic diastolic (congestive) heart failure: Secondary | ICD-10-CM

## 2023-11-30 DIAGNOSIS — E782 Mixed hyperlipidemia: Secondary | ICD-10-CM

## 2023-11-30 DIAGNOSIS — I10 Essential (primary) hypertension: Secondary | ICD-10-CM

## 2023-11-30 DIAGNOSIS — I77810 Thoracic aortic ectasia: Secondary | ICD-10-CM

## 2023-11-30 NOTE — Telephone Encounter (Signed)
-----   Message from Laurann Montana sent at 11/30/2023  7:13 AM EST ----- FYI patient speaks Spanish but understood conversation yesterday in [place] and deferred interpreter for most of the encounter. Can engage interpreter if he wants. Please let pt know kidney function looks worse on current regimen. Calcium level also up. I think this is likely from HCTZ but will need to follow. Recommend: - stop hydrochlorothiazide - stop potassium supplement - hold valsartan for 1 day  - increase water intake the next few days (not overboard) - recheck BMET 5-7 days non-fasting - check BP once a day mid day and have him submit those readings the day he gets his labs - have him review any home supplements to make sure he's not taking extra calcium at this time  Thank you!

## 2023-11-30 NOTE — Telephone Encounter (Signed)
The patient has been notified of the result and verbalized understanding.  All questions (if any) were answered. Frutoso Schatz, RN 11/30/2023 5:09 PM   Patient would like to have labs repeated at his PCP when he goes for a visit next Wednesday. Orders have been sent over.  Medication changes have been made. Patient will keep a log of his blood pressures.  Patient reports that he does take a daily Centrum multivitamin Men 50+ that contains 210 mg of calcium.

## 2023-12-01 NOTE — Telephone Encounter (Signed)
Noted  

## 2023-12-05 ENCOUNTER — Ambulatory Visit: Payer: Medicare HMO | Admitting: Nurse Practitioner

## 2023-12-07 ENCOUNTER — Ambulatory Visit: Payer: Medicare HMO | Admitting: Physician Assistant

## 2023-12-20 ENCOUNTER — Telehealth: Payer: Self-pay | Admitting: Internal Medicine

## 2023-12-20 ENCOUNTER — Ambulatory Visit (HOSPITAL_COMMUNITY): Payer: Medicare HMO | Attending: Internal Medicine

## 2023-12-20 ENCOUNTER — Other Ambulatory Visit: Payer: Self-pay

## 2023-12-20 DIAGNOSIS — I77810 Thoracic aortic ectasia: Secondary | ICD-10-CM | POA: Insufficient documentation

## 2023-12-20 DIAGNOSIS — I10 Essential (primary) hypertension: Secondary | ICD-10-CM | POA: Diagnosis present

## 2023-12-20 DIAGNOSIS — I5032 Chronic diastolic (congestive) heart failure: Secondary | ICD-10-CM | POA: Insufficient documentation

## 2023-12-20 DIAGNOSIS — E782 Mixed hyperlipidemia: Secondary | ICD-10-CM | POA: Diagnosis present

## 2023-12-20 LAB — ECHOCARDIOGRAM COMPLETE
Area-P 1/2: 2.6 cm2
S' Lateral: 3.13 cm

## 2023-12-20 MED ORDER — TIRZEPATIDE 10 MG/0.5ML ~~LOC~~ SOAJ: SUBCUTANEOUS | 0 refills | Status: DC

## 2023-12-20 NOTE — Telephone Encounter (Signed)
Attempted to call the patient, but unable to leave a message. Which medications is he requesting and which pharmacy?

## 2023-12-20 NOTE — Telephone Encounter (Signed)
Patient is inquiring about partial refills until care is established in this office due to former PCP passing. A letter was sent to patient through mychart yesterday advising patient to reach out to [facility] for medication refills in the meantime. This RN spoke with Toma Copier at [facility] regarding situation, was advised to forward CRM to see if PCP may be of assistance prior to establishing care. Alerting for review and follow up.  Copied from CRM [id number]. Topic: Clinical - Prescription Issue >> Dec 20, 2023 11:18 AM Irine Seal wrote: Reason for CRM: patients PCP passed away, and is doing a  TOC to Dr. Loreta Ave which is not until 02/16/24, patient is wanting to know for march's refills for all his current medication of the provider could give a refill for that prior to his appt time

## 2023-12-21 NOTE — Telephone Encounter (Signed)
Spoke to patient and his refills have already been sent in.

## 2024-01-10 ENCOUNTER — Other Ambulatory Visit: Payer: Self-pay

## 2024-01-10 NOTE — Addendum Note (Signed)
 Addended by: Kern Reap B on: 01/10/2024 04:34 PM   Modules accepted: Orders

## 2024-01-10 NOTE — Telephone Encounter (Signed)
 Copied from CRM [id number]. Topic: Clinical - Prescription Issue >> Jan 10, 2024  3:51 PM Roy Harris wrote: Reason for CRM: Patient was seeing Dr. Oneta Rack before he passed away, patient has a transfer of care appointment with Dr. Ardyth Harps on 4/10 - however the patient is out of tirzepatide [facility]) 10 MG/0.5ML Pen and is requesting Dr. Ardyth Harps to fill this for him as he is using his last vial right now.

## 2024-01-11 MED ORDER — TIRZEPATIDE 10 MG/0.5ML ~~LOC~~ SOAJ: SUBCUTANEOUS | 0 refills | Status: DC

## 2024-01-12 NOTE — Telephone Encounter (Signed)
 Refill sent.

## 2024-01-24 ENCOUNTER — Telehealth (HOSPITAL_BASED_OUTPATIENT_CLINIC_OR_DEPARTMENT_OTHER): Payer: Self-pay

## 2024-01-24 NOTE — Telephone Encounter (Addendum)
 Called with Spanish interpreter, No answer, unable to leave VM     ----- Message from Laurann Montana sent at 12/20/2023 10:25 AM EST ----- Please let pt know echo shows normal pump function. Echo done to f/u aorta size. 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 I would like to get further imaging of this - would choose MR Angio Chest W Wo Contrast. MRA chosen over CTA given recent borderline renal insufficiency, as long as he confirms no issues with metal fragments in his body. He did have some recent abnormal lab values - by 11/29/23 result note he was given medication instructions and had reported he was going to get f/u labs at PCP, can you see if we can get a copy of these? If he had not had those repeated, please return here to have them rechecked. Thank you!

## 2024-02-07 ENCOUNTER — Encounter: Payer: Self-pay | Admitting: Cardiology

## 2024-02-07 ENCOUNTER — Ambulatory Visit: Payer: Medicare HMO | Attending: Cardiology | Admitting: Cardiology

## 2024-02-07 VITALS — BP 124/86 | HR 65 | Ht 66.0 in | Wt 204.4 lb

## 2024-02-07 DIAGNOSIS — G4733 Obstructive sleep apnea (adult) (pediatric): Secondary | ICD-10-CM

## 2024-02-07 DIAGNOSIS — I10 Essential (primary) hypertension: Secondary | ICD-10-CM

## 2024-02-07 DIAGNOSIS — I5030 Unspecified diastolic (congestive) heart failure: Secondary | ICD-10-CM

## 2024-02-07 DIAGNOSIS — I77810 Thoracic aortic ectasia: Secondary | ICD-10-CM | POA: Diagnosis not present

## 2024-02-07 NOTE — Patient Instructions (Signed)
 Medication Instructions:  Your physician recommends that you continue on your current medications as directed. Please refer to the Current Medication list given to you today.  *If you need a refill on your cardiac medications before your next appointment, please call your pharmacy*  Lab Work: NONE  If you have labs (blood work) drawn today and your tests are completely normal, you will receive your results only by: MyChart Message (if you have MyChart) OR A paper copy in the mail If you have any lab test that is abnormal or we need to change your treatment, we will call you to review the results.  Testing/Procedures: FEB 2026- - - Your physician has requested that you have an echocardiogram. Echocardiography is a painless test that uses sound waves to create images of your heart. It provides your doctor with information about the size and shape of your heart and how well your heart's chambers and valves are working. This procedure takes approximately one hour. There are no restrictions for this procedure. Please do NOT wear cologne, perfume, aftershave, or lotions (deodorant is allowed). Please arrive 15 minutes prior to your appointment time.  Please note: We ask at that you not bring children with you during ultrasound (echo/ vascular) testing. Due to room size and safety concerns, children are not allowed in the ultrasound rooms during exams. Our front office staff cannot provide observation of children in our lobby area while testing is being conducted. An adult accompanying a patient to their appointment will only be allowed in the ultrasound room at the discretion of the ultrasound technician under special circumstances. We apologize for any inconvenience.   Follow-Up: At [facility], you and your health needs are our priority.  As part of our continuing mission to provide you with exceptional heart care, our providers are all part of one team.  This team includes your primary  Cardiologist (physician) and Advanced Practice Providers or APPs (Physician Assistants and Nurse Practitioners) who all work together to provide you with the care you need, when you need it.  Your next appointment:   12 month(s)  Provider:   Armanda Magic, MD      Other Instructions       1st Floor: - Lobby - Registration  - Pharmacy  - Lab - Cafe  2nd Floor: - PV Lab - Diagnostic Testing (echo, CT, nuclear med)  3rd Floor: - Vacant  4th Floor: - TCTS (cardiothoracic surgery) - AFib Clinic - Structural Heart Clinic - Vascular Surgery  - Vascular Ultrasound  5th Floor: - HeartCare Cardiology (general and EP) - Clinical Pharmacy for coumadin, hypertension, lipid, weight-loss medications, and med management appointments    Valet parking services will be available as well.
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
## 2024-02-10 ENCOUNTER — Encounter: Payer: Self-pay | Admitting: Cardiology

## 2024-02-16 ENCOUNTER — Other Ambulatory Visit: Payer: Self-pay

## 2024-02-16 ENCOUNTER — Ambulatory Visit (INDEPENDENT_AMBULATORY_CARE_PROVIDER_SITE_OTHER): Payer: Medicare HMO | Admitting: Internal Medicine

## 2024-02-16 ENCOUNTER — Encounter: Payer: Self-pay | Admitting: Internal Medicine

## 2024-02-16 VITALS — BP 120/74 | HR 59 | Temp 97.6°F | Ht 66.0 in | Wt 203.0 lb

## 2024-02-16 DIAGNOSIS — E785 Hyperlipidemia, unspecified: Secondary | ICD-10-CM | POA: Diagnosis not present

## 2024-02-16 DIAGNOSIS — I5030 Unspecified diastolic (congestive) heart failure: Secondary | ICD-10-CM | POA: Diagnosis not present

## 2024-02-16 DIAGNOSIS — E1169 Type 2 diabetes mellitus with other specified complication: Secondary | ICD-10-CM | POA: Diagnosis not present

## 2024-02-16 DIAGNOSIS — I5032 Chronic diastolic (congestive) heart failure: Secondary | ICD-10-CM

## 2024-02-16 DIAGNOSIS — I10 Essential (primary) hypertension: Secondary | ICD-10-CM

## 2024-02-16 DIAGNOSIS — G4733 Obstructive sleep apnea (adult) (pediatric): Secondary | ICD-10-CM

## 2024-02-16 DIAGNOSIS — I77810 Thoracic aortic ectasia: Secondary | ICD-10-CM

## 2024-02-16 DIAGNOSIS — Z7985 Long-term (current) use of injectable non-insulin antidiabetic drugs: Secondary | ICD-10-CM

## 2024-02-16 DIAGNOSIS — E782 Mixed hyperlipidemia: Secondary | ICD-10-CM

## 2024-02-16 DIAGNOSIS — E1122 Type 2 diabetes mellitus with diabetic chronic kidney disease: Secondary | ICD-10-CM

## 2024-02-16 DIAGNOSIS — N182 Chronic kidney disease, stage 2 (mild): Secondary | ICD-10-CM

## 2024-02-16 LAB — POCT GLYCOSYLATED HEMOGLOBIN (HGB A1C): Hemoglobin A1C: 5 % (ref 4.0–5.6)

## 2024-02-16 LAB — MICROALBUMIN / CREATININE URINE RATIO
Creatinine,U: 199.2 mg/dL
Microalb Creat Ratio: 4.3 mg/g (ref 0.0–30.0)
Microalb, Ur: 0.9 mg/dL (ref 0.0–1.9)

## 2024-02-16 LAB — LIPID PANEL
Cholesterol: 110 mg/dL (ref 0–200)
HDL: 40.5 mg/dL (ref >39.00)
LDL Cholesterol: 48 mg/dL (ref 0–99)
NonHDL: 69.03
Total CHOL/HDL Ratio: 3
Triglycerides: 106 mg/dL (ref 0.0–149.0)
VLDL: 21.2 mg/dL (ref 0.0–40.0)

## 2024-02-16 MED ORDER — TIRZEPATIDE 10 MG/0.5ML ~~LOC~~ SOAJ: SUBCUTANEOUS | 2 refills | Status: DC
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
## 2024-02-16 NOTE — Progress Notes (Signed)
 Order changed on CPAP device and sent to [facility]: Set Therapy mode to AutoSet Set Min Pressure to 4 cmH2O Set Max Pressure to 12 cmH2O Set EPR to Ramp Only Set EPR level to 1 cmH2O Set Ramp enable to Auto Set Start pressure to 4 cmH2O Set Response to Standard Climate settings Set climate control to Auto Set humidifier level to Auto Set heated tube temperature to Auto

## 2024-02-27 ENCOUNTER — Ambulatory Visit: Payer: Medicare HMO | Admitting: Internal Medicine

## 2024-02-29 ENCOUNTER — Telehealth: Payer: Self-pay

## 2024-02-29 ENCOUNTER — Encounter: Payer: Self-pay | Admitting: Internal Medicine

## 2024-02-29 NOTE — Telephone Encounter (Signed)
 Followed up with patient regarding his echo results. Patient has already been given these results by his PCP and stated that his PCP explained much improvement on his recent labs compared to the ones from 1/25.

## 2024-02-29 NOTE — Telephone Encounter (Signed)
-----   Message from Dayna N Dunn sent at 12/20/2023 10:25 AM EST ----- Please let pt know echo shows normal pump function. Echo done to f/u aorta size. 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 I would like to get further imaging of this - would choose MR Angio Chest W Wo Contrast. MRA chosen over CTA given recent borderline renal insufficiency, as long as he confirms no issues with metal fragments in his body. He did have some recent abnormal lab values - by 11/29/23 result note he was given medication instructions and had reported he was going to get f/u labs at PCP, can you see if we can get a copy of these? If he had not had those repeated, please return here to have them rechecked. Thank you!

## 2024-04-05 ENCOUNTER — Ambulatory Visit (INDEPENDENT_AMBULATORY_CARE_PROVIDER_SITE_OTHER): Admitting: Internal Medicine

## 2024-04-05 ENCOUNTER — Encounter: Payer: Self-pay | Admitting: Internal Medicine

## 2024-04-05 VITALS — BP 100/64 | HR 67 | Temp 97.7°F | Wt 198.6 lb

## 2024-04-05 DIAGNOSIS — E66811 Obesity, class 1: Secondary | ICD-10-CM

## 2024-04-05 DIAGNOSIS — E1122 Type 2 diabetes mellitus with diabetic chronic kidney disease: Secondary | ICD-10-CM | POA: Diagnosis not present

## 2024-04-05 DIAGNOSIS — I10 Essential (primary) hypertension: Secondary | ICD-10-CM | POA: Diagnosis not present

## 2024-04-05 DIAGNOSIS — E1169 Type 2 diabetes mellitus with other specified complication: Secondary | ICD-10-CM | POA: Diagnosis not present

## 2024-04-05 DIAGNOSIS — I5030 Unspecified diastolic (congestive) heart failure: Secondary | ICD-10-CM

## 2024-04-05 DIAGNOSIS — E785 Hyperlipidemia, unspecified: Secondary | ICD-10-CM | POA: Diagnosis not present

## 2024-04-05 DIAGNOSIS — N182 Chronic kidney disease, stage 2 (mild): Secondary | ICD-10-CM

## 2024-04-05 MED ORDER — TIRZEPATIDE 12.5 MG/0.5ML ~~LOC~~ SOAJ
12.5000 mg | SUBCUTANEOUS | 0 refills | Status: DC
Start: 2024-04-05 — End: 2024-07-02
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
## 2024-04-12 ENCOUNTER — Ambulatory Visit: Admitting: Internal Medicine

## 2024-05-30 ENCOUNTER — Encounter: Payer: Medicare HMO | Admitting: Nurse Practitioner

## 2024-07-02 ENCOUNTER — Other Ambulatory Visit: Payer: Self-pay | Admitting: Internal Medicine

## 2024-07-02 DIAGNOSIS — E1122 Type 2 diabetes mellitus with diabetic chronic kidney disease: Secondary | ICD-10-CM

## 2024-07-02 MED ORDER — ATORVASTATIN CALCIUM 10 MG PO TABS: ORAL_TABLET | ORAL | 1 refills | Status: DC

## 2024-07-02 MED ORDER — TIRZEPATIDE 12.5 MG/0.5ML ~~LOC~~ SOAJ: 12.5000 mg | SUBCUTANEOUS | 0 refills | Status: DC

## 2024-07-02 MED ORDER — GABAPENTIN 300 MG PO CAPS: 300.0000 mg | ORAL_CAPSULE | Freq: Every day | ORAL | 3 refills | Status: DC

## 2024-07-02 NOTE — Telephone Encounter (Signed)
 Copied from CRM #[id number]. Topic: Clinical - Medication Refill >> Jul 02, 2024  1:48 PM Ismael A wrote: Medication: tirzepatide  (MOUNJARO ) 12.5 MG/0.5ML Pen gabapentin  (NEURONTIN ) 300 MG capsule atorvastatin  (LIPITOR) 10 MG tablet   Has the patient contacted their pharmacy? No (Agent: If no, request that the patient contact the pharmacy for the refill. If patient does not wish to contact the pharmacy document the reason why and proceed with request.) (Agent: If yes, when and what did the pharmacy advise?)  This is the patient's preferred pharmacy:  [id number] - [place], [place] - [id number] [place] [address] Phone: [phone] Fax: [phone]  Is this the correct pharmacy for this prescription? Yes If no, delete pharmacy and type the correct one.   Has the prescription been filled recently? No  Is the patient out of the medication? Yes  Has the patient been seen for an appointment in the last year OR does the patient have an upcoming appointment? Yes  Can we respond through MyChart? Yes  Agent: Please be advised that Rx refills may take up to 3 business days. We ask that you follow-up with your pharmacy.

## 2024-07-06 ENCOUNTER — Other Ambulatory Visit: Payer: Self-pay

## 2024-07-06 MED ORDER — ATORVASTATIN CALCIUM 10 MG PO TABS: ORAL_TABLET | ORAL | 0 refills | Status: DC

## 2024-07-16 ENCOUNTER — Ambulatory Visit (INDEPENDENT_AMBULATORY_CARE_PROVIDER_SITE_OTHER)

## 2024-07-16 VITALS — BP 118/62 | HR 61 | Temp 97.6°F | Ht 67.0 in | Wt 183.7 lb

## 2024-07-16 DIAGNOSIS — Z0000 Encounter for general adult medical examination without abnormal findings: Secondary | ICD-10-CM

## 2024-07-16 NOTE — Patient Instructions (Addendum)
 [name],  Thank you for taking the time for your Medicare Wellness Visit. I appreciate your continued commitment to your health goals. Please review the care plan we discussed, and feel free to reach out if I can assist you further.  Medicare recommends these wellness visits once per year to help you and your care team stay ahead of potential health issues. These visits are designed to focus on prevention, allowing your provider to concentrate on managing your acute and chronic conditions during your regular appointments.  Please note that Annual Wellness Visits do not include a physical exam. Some assessments may be limited, especially if the visit was conducted virtually. If needed, we may recommend a separate in-person follow-up with your provider.  Ongoing Care Seeing your primary care provider every 3 to 6 months helps us  monitor your health and provide consistent, personalized care.   Referrals If a referral was made during today's visit and you haven't received any updates within two weeks, please contact the referred provider directly to check on the status.  Recommended Screenings:  Health Maintenance  Topic Date Due   Eye exam for diabetics  Never done   COVID-19 Vaccine (3 - Pfizer risk series) 02/12/2020   Colon Cancer Screening  08/06/2023   Flu Shot  06/08/2024   Hemoglobin A1C  08/17/2024   Yearly kidney function blood test for diabetes  11/28/2024   Yearly kidney health urinalysis for diabetes  02/15/2025   Complete foot exam   02/15/2025   Medicare Annual Wellness Visit  07/16/2025   DTaP/Tdap/Td vaccine (2 - Td or Tdap) 07/07/2028   Pneumococcal Vaccine for age over 26  Completed   Hepatitis C Screening  Completed   Zoster (Shingles) Vaccine  Completed   HPV Vaccine  Aged Out   Meningitis B Vaccine  Aged Out   Cologuard (Stool DNA test)  Discontinued       07/16/2024   10:25 AM  Advanced Directives  Does Patient Have a Medical Advance Directive? No   Would patient like information on creating a medical advance directive? Yes (MAU/Ambulatory/Procedural Areas - Information given)   Advance Care Planning is important because it: Ensures you receive medical care that aligns with your values, goals, and preferences. Provides guidance to your family and loved ones, reducing the emotional burden of decision-making during critical moments.  Vision: Annual vision screenings are recommended for early detection of glaucoma, cataracts, and diabetic retinopathy. These exams can also reveal signs of chronic conditions such as diabetes and high blood pressure.  Dental: Annual dental screenings help detect early signs of oral cancer, gum disease, and other conditions linked to overall health, including heart disease and diabetes.  Please see the attached documents for additional preventive care recommendations.
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
## 2024-08-01 LAB — HM DIABETES EYE EXAM

## 2024-08-03 DIAGNOSIS — H524 Presbyopia: Secondary | ICD-10-CM | POA: Diagnosis not present

## 2024-08-07 ENCOUNTER — Other Ambulatory Visit: Payer: Self-pay

## 2024-08-07 DIAGNOSIS — I5030 Unspecified diastolic (congestive) heart failure: Secondary | ICD-10-CM

## 2024-08-07 DIAGNOSIS — Z79899 Other long term (current) drug therapy: Secondary | ICD-10-CM

## 2024-08-07 DIAGNOSIS — I10 Essential (primary) hypertension: Secondary | ICD-10-CM

## 2024-08-07 MED ORDER — VALSARTAN 80 MG PO TABS: 80.0000 mg | ORAL_TABLET | Freq: Every day | ORAL | 3 refills | Status: DC

## 2024-08-15 ENCOUNTER — Encounter: Payer: Self-pay | Admitting: Internal Medicine

## 2024-08-15 ENCOUNTER — Ambulatory Visit (INDEPENDENT_AMBULATORY_CARE_PROVIDER_SITE_OTHER): Admitting: Internal Medicine

## 2024-08-15 VITALS — BP 110/70 | HR 94 | Temp 97.9°F | Wt 180.4 lb

## 2024-08-15 DIAGNOSIS — Z23 Encounter for immunization: Secondary | ICD-10-CM | POA: Diagnosis not present

## 2024-08-15 DIAGNOSIS — E1122 Type 2 diabetes mellitus with diabetic chronic kidney disease: Secondary | ICD-10-CM

## 2024-08-15 DIAGNOSIS — I10 Essential (primary) hypertension: Secondary | ICD-10-CM

## 2024-08-15 DIAGNOSIS — E785 Hyperlipidemia, unspecified: Secondary | ICD-10-CM

## 2024-08-15 DIAGNOSIS — N182 Chronic kidney disease, stage 2 (mild): Secondary | ICD-10-CM | POA: Diagnosis not present

## 2024-08-15 DIAGNOSIS — E1169 Type 2 diabetes mellitus with other specified complication: Secondary | ICD-10-CM

## 2024-08-15 LAB — POCT GLYCOSYLATED HEMOGLOBIN (HGB A1C): Hemoglobin A1C: 5 % (ref 4.0–5.6)

## 2024-08-15 NOTE — Addendum Note (Signed)
 Addended by: KATHRYNE MILLMAN B on: 08/15/2024 10:50 AM   Modules accepted: Orders
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
## 2024-08-15 NOTE — Assessment & Plan Note (Signed)
 Most recent LDL is 48, continue Lipitor 10 mg.

## 2024-08-15 NOTE — Assessment & Plan Note (Signed)
 Well-controlled on current.

## 2024-08-15 NOTE — Assessment & Plan Note (Signed)
 Well-controlled with an A1c of 5.0.

## 2024-08-21 DIAGNOSIS — Z7982 Long term (current) use of aspirin: Secondary | ICD-10-CM | POA: Diagnosis not present

## 2024-08-21 DIAGNOSIS — Z7985 Long-term (current) use of injectable non-insulin antidiabetic drugs: Secondary | ICD-10-CM | POA: Diagnosis not present

## 2024-08-21 DIAGNOSIS — E1142 Type 2 diabetes mellitus with diabetic polyneuropathy: Secondary | ICD-10-CM | POA: Diagnosis not present

## 2024-08-21 DIAGNOSIS — G4733 Obstructive sleep apnea (adult) (pediatric): Secondary | ICD-10-CM | POA: Diagnosis not present

## 2024-08-21 DIAGNOSIS — I10 Essential (primary) hypertension: Secondary | ICD-10-CM | POA: Diagnosis not present

## 2024-08-21 DIAGNOSIS — E785 Hyperlipidemia, unspecified: Secondary | ICD-10-CM | POA: Diagnosis not present

## 2024-09-03 ENCOUNTER — Ambulatory Visit: Payer: Medicare HMO | Admitting: Internal Medicine

## 2024-09-05 ENCOUNTER — Ambulatory Visit: Admitting: Internal Medicine

## 2024-09-14 ENCOUNTER — Ambulatory Visit: Payer: Self-pay

## 2024-09-14 ENCOUNTER — Other Ambulatory Visit: Payer: Self-pay | Admitting: Internal Medicine

## 2024-09-14 DIAGNOSIS — E1122 Type 2 diabetes mellitus with diabetic chronic kidney disease: Secondary | ICD-10-CM

## 2024-09-14 NOTE — Telephone Encounter (Signed)
 This RN made third and final attempt to triage patient. No answer, unable to LVM due to full VMB. Routing to office.

## 2024-09-14 NOTE — Telephone Encounter (Signed)
 This RN made second attempt to triage patient. No answer, unable to LVM due to full VMB. Routing for additional attempts.

## 2024-09-14 NOTE — Telephone Encounter (Signed)
 Please see recent NT encounter.

## 2024-09-14 NOTE — Telephone Encounter (Signed)
 This made first attempt to triage patient, with interpreter on the line. No answer, unable to LVM due to full VMB. Routing for additional attempts.   Per refill encounter, the patient is wondering if the medication could be decreased to 10mg  since he was losing weight too fast and noticed that the medication caused constipation.  Copied from CRM #[id number]. Topic: Clinical - Medication Refill >> Sep 14, 2024 10:05 AM China J wrote: Medication: tirzepatide  (MOUNJARO ) 12.5 MG/0.5ML Pen   Has the patient contacted their pharmacy? Yes (Agent: If no, request that the patient contact the pharmacy for the refill. If patient does not wish to contact the pharmacy document the reason why and proceed with request.) (Agent: If yes, when and what did the pharmacy advise?)   This is the patient's preferred pharmacy:  [id number] - [place], [place] - [address] [place] [address] Phone: [phone] Fax: [phone]   Is this the correct pharmacy for this prescription? Yes If no, delete pharmacy and type the correct one.    Has the prescription been filled recently? No   Is the patient out of the medication? Yes   Has the patient been seen for an appointment in the last year OR does the patient have an upcoming appointment? Yes   Can we respond through MyChart? Yes   Agent: Please be advised that Rx refills may take up to 3 business days. We ask that you follow-up with your pharmacy.

## 2024-09-14 NOTE — Telephone Encounter (Unsigned)
 Copied from CRM #[id number]. Topic: Clinical - Medication Refill >> Sep 14, 2024 10:05 AM China J wrote: Medication: tirzepatide  (MOUNJARO ) 12.5 MG/0.5ML Pen  Has the patient contacted their pharmacy? Yes (Agent: If no, request that the patient contact the pharmacy for the refill. If patient does not wish to contact the pharmacy document the reason why and proceed with request.) (Agent: If yes, when and what did the pharmacy advise?)  This is the patient's preferred pharmacy:  [id number] - [place], [place] - [address] [place] [address] Phone: [phone] Fax: [phone]  Is this the correct pharmacy for this prescription? Yes If no, delete pharmacy and type the correct one.   Has the prescription been filled recently? No  Is the patient out of the medication? Yes  Has the patient been seen for an appointment in the last year OR does the patient have an upcoming appointment? Yes  Can we respond through MyChart? Yes  Agent: Please be advised that Rx refills may take up to 3 business days. We ask that you follow-up with your pharmacy.

## 2024-09-17 ENCOUNTER — Other Ambulatory Visit: Payer: Self-pay | Admitting: Internal Medicine

## 2024-09-17 DIAGNOSIS — E1122 Type 2 diabetes mellitus with diabetic chronic kidney disease: Secondary | ICD-10-CM

## 2024-09-17 MED ORDER — TIRZEPATIDE 12.5 MG/0.5ML ~~LOC~~ SOAJ: 12.5000 mg | SUBCUTANEOUS | 0 refills | Status: DC

## 2024-09-24 ENCOUNTER — Telehealth: Payer: Self-pay | Admitting: *Deleted

## 2024-09-24 NOTE — Telephone Encounter (Signed)
 Patient states that the dosage that he is currently taking is causing constipation and too much weight loss.  He currently weighs 170 lbs.  He would like to go back on 10 mg if possible.

## 2024-09-24 NOTE — Telephone Encounter (Signed)
 Copied from CRM [id number]. Topic: Clinical - Prescription Issue >> Sep 24, 2024  3:18 PM Alfonso HERO wrote: Reason for CRM: patient calling because the tirzepatide  (MOUNJARO ) 12.5 MG/0.5ML Pen is too strong and wants to get a lower dosage sent over to the pharmacy.

## 2024-09-25 MED ORDER — TIRZEPATIDE 10 MG/0.5ML ~~LOC~~ SOAJ: 10.0000 mg | SUBCUTANEOUS | 0 refills | Status: DC

## 2024-09-25 NOTE — Telephone Encounter (Signed)
 Rx sent.  Medication list updated.  Patient is aware.

## 2024-09-28 NOTE — Progress Notes (Signed)
   09/28/2024  Patient ID: Roy Harris, male   DOB: 1951/01/31, 73 y.o.   MRN: [id number]  Pharmacy Quality Measure Review  This patient is appearing on a report for being at risk of failing the adherence measure for cholesterol (statin) medications this calendar year.   Medication: Atorvastatin  Last fill date: 09/26/24 for 90 day supply  Insurance report was not up to date. No action needed at this time.   [name], PharmD Clinical Pharmacist [phone]

## 2024-09-29 ENCOUNTER — Other Ambulatory Visit: Payer: Self-pay | Admitting: Family Medicine

## 2024-09-29 DIAGNOSIS — E118 Type 2 diabetes mellitus with unspecified complications: Secondary | ICD-10-CM

## 2024-10-04 ENCOUNTER — Encounter (HOSPITAL_BASED_OUTPATIENT_CLINIC_OR_DEPARTMENT_OTHER): Payer: Self-pay

## 2024-10-04 ENCOUNTER — Other Ambulatory Visit: Payer: Self-pay

## 2024-10-04 ENCOUNTER — Emergency Department (HOSPITAL_BASED_OUTPATIENT_CLINIC_OR_DEPARTMENT_OTHER)
Admission: EM | Admit: 2024-10-04 | Discharge: 2024-10-04 | Disposition: A | Discharge status: Home / Self Care | Attending: Emergency Medicine | Admitting: Emergency Medicine

## 2024-10-04 DIAGNOSIS — Z79899 Other long term (current) drug therapy: Secondary | ICD-10-CM | POA: Insufficient documentation

## 2024-10-04 DIAGNOSIS — Z7982 Long term (current) use of aspirin: Secondary | ICD-10-CM | POA: Diagnosis not present

## 2024-10-04 DIAGNOSIS — I509 Heart failure, unspecified: Secondary | ICD-10-CM | POA: Insufficient documentation

## 2024-10-04 DIAGNOSIS — E119 Type 2 diabetes mellitus without complications: Secondary | ICD-10-CM | POA: Diagnosis not present

## 2024-10-04 DIAGNOSIS — H1132 Conjunctival hemorrhage, left eye: Secondary | ICD-10-CM | POA: Diagnosis not present

## 2024-10-04 MED ORDER — TETRACAINE HCL 0.5 % OP SOLN
2.0000 [drp] | Freq: Once | OPHTHALMIC | Status: AC
  Administered 2024-10-04: 2 [drp] via OPHTHALMIC
  Filled 2024-10-04: qty 4

## 2024-10-04 MED ORDER — FLUORESCEIN SODIUM 1 MG OP STRP
1.0000 | ORAL_STRIP | Freq: Once | OPHTHALMIC | Status: AC
  Administered 2024-10-04: 1 via OPHTHALMIC
  Filled 2024-10-04: qty 1

## 2024-10-04 NOTE — ED Triage Notes (Signed)
 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

## 2024-10-04 NOTE — ED Provider Notes (Signed)
 [facility] AT [facility] Provider Note   CSN: [id number] Arrival date & time: 10/04/24  1432     Patient presents with: No chief complaint on file.   Roy Harris is a 73 y.o. male.   Pt is a 73 yo male with pmhx significant for hld, chf, dm, and sleep apnea.  Pt noticed left eye redness this am.  He denies any pain or any difficulty seeing.  No trauma.       Prior to Admission medications   Medication Sig Start Date End Date Taking? Authorizing Provider  aspirin  EC 81 MG tablet Take 1 tablet (81 mg total) by mouth daily. 07/23/19   Jodie Lavern CROME, MD  atorvastatin  (LIPITOR) 10 MG tablet TAKE 1 TABLET(10 MG) BY MOUTH EVERY NIGHT AT BEDTIME 07/06/24   Dunn, Dayna N, PA-C  fluticasone  (FLONASE ) 50 MCG/ACT nasal spray Place 1 spray into both nostrils daily. Patient taking differently: Place 1 spray into both nostrils as needed for allergies. 01/20/23   Cranford, Tonya, NP  gabapentin  (NEURONTIN ) 300 MG capsule Take 1 capsule (300 mg total) by mouth at bedtime. 07/02/24   Theophilus Andrews, Tully GRADE, MD  tirzepatide  (MOUNJARO ) 10 MG/0.5ML Pen Inject 10 mg into the skin once a week. 09/25/24   Theophilus Andrews, Tully GRADE, MD  valsartan  (DIOVAN ) 80 MG tablet Take 1 tablet (80 mg total) by mouth daily. 08/07/24   Shlomo Wilbert SAUNDERS, MD    Allergies: Patient has no known allergies.    Review of Systems  Eyes:  Positive for redness.  All other systems reviewed and are negative.   Updated Vital Signs BP 106/64   Pulse 60   Temp 98 F (36.7 C)   Resp 18   SpO2 98%   Physical Exam Vitals and nursing note reviewed.  Constitutional:      Appearance: Normal appearance.  HENT:     Head: Normocephalic and atraumatic.     Right Ear: External ear normal.     Left Ear: External ear normal.     Nose: Nose normal.     Mouth/Throat:     Mouth: Mucous membranes are moist.     Pharynx: Oropharynx is clear.  Eyes:     Extraocular Movements: Extraocular  movements intact.     Conjunctiva/sclera:     Left eye: Hemorrhage present.     Pupils: Pupils are equal, round, and reactive to light.     Left eye: No corneal abrasion or fluorescein  uptake. Seidel exam negative. Cardiovascular:     Rate and Rhythm: Normal rate and regular rhythm.     Pulses: Normal pulses.     Heart sounds: Normal heart sounds.  Pulmonary:     Effort: Pulmonary effort is normal.     Breath sounds: Normal breath sounds.  Abdominal:     General: Abdomen is flat. Bowel sounds are normal.     Palpations: Abdomen is soft.  Musculoskeletal:        General: Normal range of motion.     Cervical back: Normal range of motion and neck supple.  Skin:    General: Skin is warm.     Capillary Refill: Capillary refill takes less than 2 seconds.  Neurological:     General: No focal deficit present.     Mental Status: He is alert and oriented to person, place, and time.  Psychiatric:        Mood and Affect: Mood normal.        Behavior:  Behavior normal.     (all labs ordered are listed, but only abnormal results are displayed) Labs Reviewed - No data to display  EKG: None  Radiology: No results found.   Procedures   Medications Ordered in the ED  fluorescein  ophthalmic strip 1 strip (1 strip Left Eye Given by Other 10/04/24 1522)  tetracaine  (PONTOCAINE) 0.5 % ophthalmic solution 2 drop (2 drops Left Eye Given by Other 10/04/24 1522)                                    Medical Decision Making Risk Prescription drug management.   This patient presents to the ED for concern of eye redness, this involves an extensive number of treatment options, and is a complaint that carries with it a high risk of complications and morbidity.  The differential diagnosis includes subconjunctival hemorrhage, conjunctivitis, trauma, glaucoma   Co morbidities that complicate the patient evaluation  ld, chf, dm, and sleep apnea   Additional history obtained:  Additional  history obtained from epic chart review External records from outside source obtained and reviewed including family  Medicines ordered and prescription drug management:   I have reviewed the patients home medicines and have made adjustments as needed   Problem List / ED Course:  Left eye subconjunctival hemorrhage:  no vision complaints.  No pain to the eye.  No abn on slit lamp exam.  Pt is told to hold his asa today and tomorrow.  He is to return if worse.  F/u with pcp.   Reevaluation:  After the interventions noted above, I reevaluated the patient and found that they have :improved   Social Determinants of Health:  Lives at home   Dispostion:  After consideration of the diagnostic results and the patients response to treatment, I feel that the patent would benefit from discharge with outpatient f/u.       Final diagnoses:  Subconjunctival hemorrhage of left eye    ED Discharge Orders     None          Dean Clarity, MD 10/04/24 1531

## 2024-11-15 ENCOUNTER — Encounter: Payer: Self-pay | Admitting: Internal Medicine

## 2024-11-15 ENCOUNTER — Ambulatory Visit: Admitting: Internal Medicine

## 2024-11-15 VITALS — BP 120/78 | HR 68 | Temp 97.5°F | Wt 183.0 lb

## 2024-11-15 DIAGNOSIS — Z7985 Long-term (current) use of injectable non-insulin antidiabetic drugs: Secondary | ICD-10-CM

## 2024-11-15 DIAGNOSIS — Z79899 Other long term (current) drug therapy: Secondary | ICD-10-CM

## 2024-11-15 DIAGNOSIS — E785 Hyperlipidemia, unspecified: Secondary | ICD-10-CM | POA: Diagnosis not present

## 2024-11-15 DIAGNOSIS — I5030 Unspecified diastolic (congestive) heart failure: Secondary | ICD-10-CM

## 2024-11-15 DIAGNOSIS — E1169 Type 2 diabetes mellitus with other specified complication: Secondary | ICD-10-CM

## 2024-11-15 DIAGNOSIS — N182 Chronic kidney disease, stage 2 (mild): Secondary | ICD-10-CM

## 2024-11-15 DIAGNOSIS — Z23 Encounter for immunization: Secondary | ICD-10-CM | POA: Diagnosis not present

## 2024-11-15 DIAGNOSIS — E1122 Type 2 diabetes mellitus with diabetic chronic kidney disease: Secondary | ICD-10-CM | POA: Diagnosis not present

## 2024-11-15 DIAGNOSIS — I10 Essential (primary) hypertension: Secondary | ICD-10-CM

## 2024-11-15 DIAGNOSIS — I130 Hypertensive heart and chronic kidney disease with heart failure and stage 1 through stage 4 chronic kidney disease, or unspecified chronic kidney disease: Secondary | ICD-10-CM | POA: Diagnosis not present

## 2024-11-15 LAB — POCT GLYCOSYLATED HEMOGLOBIN (HGB A1C): Hemoglobin A1C: 5.2 % (ref 4.0–5.6)

## 2024-11-15 MED ORDER — ATORVASTATIN CALCIUM 10 MG PO TABS: ORAL_TABLET | ORAL | 0 refills | Status: AC

## 2024-11-15 MED ORDER — TIRZEPATIDE 12.5 MG/0.5ML ~~LOC~~ SOAJ: 12.5000 mg | SUBCUTANEOUS | 0 refills | Status: AC

## 2024-11-15 MED ORDER — VALSARTAN 80 MG PO TABS: 80.0000 mg | ORAL_TABLET | Freq: Every day | ORAL | 1 refills | Status: AC

## 2024-11-15 MED ORDER — GABAPENTIN 300 MG PO CAPS: 300.0000 mg | ORAL_CAPSULE | Freq: Every day | ORAL | 1 refills | Status: AC

## 2024-11-15 NOTE — Assessment & Plan Note (Signed)
 Most recent LDL is 48, continue atorvastatin  10 mg.

## 2024-11-15 NOTE — Assessment & Plan Note (Signed)
 Well-controlled, no chest pain, euvolemic.
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
## 2024-11-15 NOTE — Assessment & Plan Note (Signed)
 Well-controlled on current.

## 2024-11-15 NOTE — Assessment & Plan Note (Signed)
 Well-controlled with an A1c of 5.2.  Increase Mounjaro  to 12.5 mg to augment weight loss.

## 2024-11-15 NOTE — Addendum Note (Signed)
 Addended by: KATHRYNE MILLMAN B on: 11/15/2024 05:00 PM   Modules accepted: Orders

## 2024-12-11 ENCOUNTER — Other Ambulatory Visit (HOSPITAL_COMMUNITY)

## 2024-12-11 ENCOUNTER — Ambulatory Visit (HOSPITAL_COMMUNITY)

## 2024-12-11 ENCOUNTER — Encounter (HOSPITAL_COMMUNITY): Payer: Self-pay | Admitting: Cardiology

## 2025-01-15 ENCOUNTER — Ambulatory Visit (HOSPITAL_COMMUNITY)

## 2025-03-06 ENCOUNTER — Encounter: Admitting: Internal Medicine
